# Patient Record
Sex: Female | Born: 1960 | Race: White | Hispanic: No | Marital: Married | Smoking: Never smoker
Health system: Southern US, Community
[De-identification: ages and names within clinical notes are randomized; demographics above are authoritative.]

## PROBLEM LIST (undated history)

## (undated) DIAGNOSIS — I2510 Atherosclerotic heart disease of native coronary artery without angina pectoris: Secondary | ICD-10-CM

## (undated) DIAGNOSIS — K219 Gastro-esophageal reflux disease without esophagitis: Secondary | ICD-10-CM

## (undated) DIAGNOSIS — R87619 Unspecified abnormal cytological findings in specimens from cervix uteri: Secondary | ICD-10-CM

## (undated) DIAGNOSIS — E785 Hyperlipidemia, unspecified: Secondary | ICD-10-CM

## (undated) DIAGNOSIS — F419 Anxiety disorder, unspecified: Secondary | ICD-10-CM

## (undated) DIAGNOSIS — E119 Type 2 diabetes mellitus without complications: Secondary | ICD-10-CM

## (undated) DIAGNOSIS — T7840XA Allergy, unspecified, initial encounter: Secondary | ICD-10-CM

## (undated) DIAGNOSIS — Z8489 Family history of other specified conditions: Secondary | ICD-10-CM

## (undated) DIAGNOSIS — Z973 Presence of spectacles and contact lenses: Secondary | ICD-10-CM

## (undated) DIAGNOSIS — I219 Acute myocardial infarction, unspecified: Secondary | ICD-10-CM

## (undated) DIAGNOSIS — M810 Age-related osteoporosis without current pathological fracture: Secondary | ICD-10-CM

## (undated) DIAGNOSIS — H409 Unspecified glaucoma: Secondary | ICD-10-CM

## (undated) HISTORY — DX: Atherosclerotic heart disease of native coronary artery without angina pectoris: I25.10

## (undated) HISTORY — DX: Unspecified glaucoma: H40.9

## (undated) HISTORY — DX: Acute myocardial infarction, unspecified: I21.9

## (undated) HISTORY — DX: Allergy, unspecified, initial encounter: T78.40XA

---

## 1982-09-25 HISTORY — PX: WISDOM TOOTH EXTRACTION: SHX21

## 1998-01-28 ENCOUNTER — Other Ambulatory Visit: Admission: RE | Admit: 1998-01-28 | Discharge: 1998-01-28 | Payer: Self-pay | Admitting: Obstetrics and Gynecology

## 1998-02-25 ENCOUNTER — Other Ambulatory Visit: Admission: RE | Admit: 1998-02-25 | Discharge: 1998-02-25 | Payer: Self-pay | Admitting: Obstetrics and Gynecology

## 1998-06-22 ENCOUNTER — Other Ambulatory Visit: Admission: RE | Admit: 1998-06-22 | Discharge: 1998-06-22 | Payer: Self-pay | Admitting: Obstetrics and Gynecology

## 1998-07-14 ENCOUNTER — Other Ambulatory Visit: Admission: RE | Admit: 1998-07-14 | Discharge: 1998-07-14 | Payer: Self-pay | Admitting: Obstetrics and Gynecology

## 1998-11-25 ENCOUNTER — Other Ambulatory Visit: Admission: RE | Admit: 1998-11-25 | Discharge: 1998-11-25 | Payer: Self-pay | Admitting: Obstetrics and Gynecology

## 1999-05-27 ENCOUNTER — Other Ambulatory Visit: Admission: RE | Admit: 1999-05-27 | Discharge: 1999-05-27 | Payer: Self-pay | Admitting: Obstetrics and Gynecology

## 2000-01-02 ENCOUNTER — Other Ambulatory Visit: Admission: RE | Admit: 2000-01-02 | Discharge: 2000-01-02 | Payer: Self-pay | Admitting: Obstetrics and Gynecology

## 2000-05-18 ENCOUNTER — Other Ambulatory Visit: Admission: RE | Admit: 2000-05-18 | Discharge: 2000-05-18 | Payer: Self-pay | Admitting: Obstetrics and Gynecology

## 2000-12-10 ENCOUNTER — Other Ambulatory Visit: Admission: RE | Admit: 2000-12-10 | Discharge: 2000-12-10 | Payer: Self-pay | Admitting: Obstetrics and Gynecology

## 2001-12-12 ENCOUNTER — Other Ambulatory Visit: Admission: RE | Admit: 2001-12-12 | Discharge: 2001-12-12 | Payer: Self-pay | Admitting: Obstetrics and Gynecology

## 2002-06-26 ENCOUNTER — Encounter: Admission: RE | Admit: 2002-06-26 | Discharge: 2002-06-26 | Payer: Self-pay | Admitting: Obstetrics and Gynecology

## 2002-06-26 ENCOUNTER — Encounter: Payer: Self-pay | Admitting: Obstetrics and Gynecology

## 2003-01-19 ENCOUNTER — Other Ambulatory Visit: Admission: RE | Admit: 2003-01-19 | Discharge: 2003-01-19 | Payer: Self-pay | Admitting: Obstetrics and Gynecology

## 2003-03-17 ENCOUNTER — Encounter: Payer: Self-pay | Admitting: Obstetrics and Gynecology

## 2003-03-17 ENCOUNTER — Encounter: Admission: RE | Admit: 2003-03-17 | Discharge: 2003-03-17 | Payer: Self-pay | Admitting: Obstetrics and Gynecology

## 2003-06-30 ENCOUNTER — Encounter: Admission: RE | Admit: 2003-06-30 | Discharge: 2003-06-30 | Payer: Self-pay | Admitting: Obstetrics and Gynecology

## 2003-06-30 ENCOUNTER — Encounter: Payer: Self-pay | Admitting: Obstetrics and Gynecology

## 2004-02-03 ENCOUNTER — Other Ambulatory Visit: Admission: RE | Admit: 2004-02-03 | Discharge: 2004-02-03 | Payer: Self-pay | Admitting: Obstetrics and Gynecology

## 2004-07-04 ENCOUNTER — Encounter: Admission: RE | Admit: 2004-07-04 | Discharge: 2004-07-04 | Payer: Self-pay | Admitting: Obstetrics and Gynecology

## 2004-08-02 ENCOUNTER — Encounter: Admission: RE | Admit: 2004-08-02 | Discharge: 2004-08-02 | Payer: Self-pay | Admitting: Obstetrics and Gynecology

## 2004-11-01 ENCOUNTER — Encounter: Admission: RE | Admit: 2004-11-01 | Discharge: 2004-11-01 | Payer: Self-pay | Admitting: Obstetrics and Gynecology

## 2005-02-08 ENCOUNTER — Other Ambulatory Visit: Admission: RE | Admit: 2005-02-08 | Discharge: 2005-02-08 | Payer: Self-pay | Admitting: Obstetrics and Gynecology

## 2005-07-05 ENCOUNTER — Encounter: Admission: RE | Admit: 2005-07-05 | Discharge: 2005-07-05 | Payer: Self-pay | Admitting: Obstetrics and Gynecology

## 2006-03-06 ENCOUNTER — Other Ambulatory Visit: Admission: RE | Admit: 2006-03-06 | Discharge: 2006-03-06 | Payer: Self-pay | Admitting: Obstetrics and Gynecology

## 2006-07-09 ENCOUNTER — Encounter: Admission: RE | Admit: 2006-07-09 | Discharge: 2006-07-09 | Payer: Self-pay | Admitting: Obstetrics and Gynecology

## 2006-07-19 ENCOUNTER — Encounter: Admission: RE | Admit: 2006-07-19 | Discharge: 2006-07-19 | Payer: Self-pay | Admitting: Obstetrics and Gynecology

## 2006-11-22 ENCOUNTER — Other Ambulatory Visit: Admission: RE | Admit: 2006-11-22 | Discharge: 2006-11-22 | Payer: Self-pay | Admitting: Obstetrics and Gynecology

## 2007-03-11 ENCOUNTER — Other Ambulatory Visit: Admission: RE | Admit: 2007-03-11 | Discharge: 2007-03-11 | Payer: Self-pay | Admitting: Obstetrics and Gynecology

## 2007-07-11 ENCOUNTER — Encounter: Admission: RE | Admit: 2007-07-11 | Discharge: 2007-07-11 | Payer: Self-pay | Admitting: Obstetrics and Gynecology

## 2007-09-09 ENCOUNTER — Other Ambulatory Visit: Admission: RE | Admit: 2007-09-09 | Discharge: 2007-09-09 | Payer: Self-pay | Admitting: Obstetrics and Gynecology

## 2008-03-11 ENCOUNTER — Other Ambulatory Visit: Admission: RE | Admit: 2008-03-11 | Discharge: 2008-03-11 | Payer: Self-pay | Admitting: Obstetrics and Gynecology

## 2008-07-13 ENCOUNTER — Encounter: Admission: RE | Admit: 2008-07-13 | Discharge: 2008-07-13 | Payer: Self-pay | Admitting: Obstetrics and Gynecology

## 2008-09-09 ENCOUNTER — Other Ambulatory Visit: Admission: RE | Admit: 2008-09-09 | Discharge: 2008-09-09 | Payer: Self-pay | Admitting: Obstetrics and Gynecology

## 2009-03-15 ENCOUNTER — Other Ambulatory Visit: Admission: RE | Admit: 2009-03-15 | Discharge: 2009-03-15 | Payer: Self-pay | Admitting: Obstetrics and Gynecology

## 2009-07-14 ENCOUNTER — Encounter: Admission: RE | Admit: 2009-07-14 | Discharge: 2009-07-14 | Payer: Self-pay | Admitting: Obstetrics and Gynecology

## 2010-07-18 ENCOUNTER — Encounter: Admission: RE | Admit: 2010-07-18 | Discharge: 2010-07-18 | Payer: Self-pay | Admitting: Obstetrics and Gynecology

## 2010-10-15 ENCOUNTER — Encounter: Payer: Self-pay | Admitting: Obstetrics and Gynecology

## 2010-10-16 ENCOUNTER — Encounter: Payer: Self-pay | Admitting: Obstetrics and Gynecology

## 2011-01-04 ENCOUNTER — Other Ambulatory Visit (HOSPITAL_COMMUNITY): Payer: Self-pay | Admitting: Obstetrics and Gynecology

## 2011-01-04 DIAGNOSIS — Z1231 Encounter for screening mammogram for malignant neoplasm of breast: Secondary | ICD-10-CM

## 2011-07-20 ENCOUNTER — Ambulatory Visit
Admission: RE | Admit: 2011-07-20 | Discharge: 2011-07-20 | Disposition: A | Discharge status: Home / Self Care | Payer: 59 | Source: Ambulatory Visit | Attending: Obstetrics and Gynecology | Admitting: Obstetrics and Gynecology

## 2011-07-20 DIAGNOSIS — Z1231 Encounter for screening mammogram for malignant neoplasm of breast: Secondary | ICD-10-CM

## 2012-02-28 ENCOUNTER — Other Ambulatory Visit: Payer: Self-pay | Admitting: Obstetrics and Gynecology

## 2012-02-28 DIAGNOSIS — Z1231 Encounter for screening mammogram for malignant neoplasm of breast: Secondary | ICD-10-CM

## 2012-07-22 ENCOUNTER — Ambulatory Visit
Admission: RE | Admit: 2012-07-22 | Discharge: 2012-07-22 | Disposition: A | Discharge status: Home / Self Care | Payer: 59 | Source: Ambulatory Visit | Attending: Obstetrics and Gynecology | Admitting: Obstetrics and Gynecology

## 2012-07-22 DIAGNOSIS — Z1231 Encounter for screening mammogram for malignant neoplasm of breast: Secondary | ICD-10-CM

## 2012-07-24 ENCOUNTER — Other Ambulatory Visit: Payer: Self-pay | Admitting: Obstetrics and Gynecology

## 2012-07-24 DIAGNOSIS — R928 Other abnormal and inconclusive findings on diagnostic imaging of breast: Secondary | ICD-10-CM

## 2012-07-31 ENCOUNTER — Ambulatory Visit
Admission: RE | Admit: 2012-07-31 | Discharge: 2012-07-31 | Disposition: A | Discharge status: Home / Self Care | Payer: 59 | Source: Ambulatory Visit | Attending: Obstetrics and Gynecology | Admitting: Obstetrics and Gynecology

## 2012-07-31 DIAGNOSIS — R928 Other abnormal and inconclusive findings on diagnostic imaging of breast: Secondary | ICD-10-CM

## 2013-03-03 ENCOUNTER — Other Ambulatory Visit: Payer: Self-pay

## 2013-03-03 DIAGNOSIS — Z1231 Encounter for screening mammogram for malignant neoplasm of breast: Secondary | ICD-10-CM

## 2013-03-05 ENCOUNTER — Encounter: Payer: Self-pay | Admitting: Gastroenterology

## 2013-04-23 ENCOUNTER — Other Ambulatory Visit: Payer: 59 | Admitting: Gastroenterology

## 2013-05-05 ENCOUNTER — Encounter: Payer: 59 | Admitting: Gastroenterology

## 2013-05-05 ENCOUNTER — Ambulatory Visit (AMBULATORY_SURGERY_CENTER): Payer: 59 | Admitting: *Deleted

## 2013-05-05 VITALS — Ht 63.5 in | Wt 128.2 lb

## 2013-05-05 DIAGNOSIS — Z1211 Encounter for screening for malignant neoplasm of colon: Secondary | ICD-10-CM

## 2013-05-05 MED ORDER — MOVIPREP 100 G PO SOLR: ORAL | Status: DC

## 2013-05-06 ENCOUNTER — Encounter: Payer: Self-pay | Admitting: Gastroenterology

## 2013-05-12 ENCOUNTER — Ambulatory Visit (AMBULATORY_SURGERY_CENTER): Payer: 59 | Admitting: Gastroenterology

## 2013-05-12 ENCOUNTER — Encounter: Payer: Self-pay | Admitting: Gastroenterology

## 2013-05-12 VITALS — BP 135/79 | HR 64 | Temp 97.7°F | Resp 18 | Ht 63.0 in | Wt 128.0 lb

## 2013-05-12 DIAGNOSIS — Z1211 Encounter for screening for malignant neoplasm of colon: Secondary | ICD-10-CM

## 2013-05-12 DIAGNOSIS — D126 Benign neoplasm of colon, unspecified: Secondary | ICD-10-CM

## 2013-05-12 MED ORDER — SODIUM CHLORIDE 0.9 % IV SOLN: 500.0000 mL | INTRAVENOUS | Status: DC

## 2013-05-12 NOTE — Patient Instructions (Signed)
YOU HAD AN ENDOSCOPIC PROCEDURE TODAY AT THE [facility]: Refer to the procedure report that was given to you for any specific questions about what was found during the examination.  If the procedure report does not answer your questions, please call your gastroenterologist to clarify.  If you requested that your care partner not be given the details of your procedure findings, then the procedure report has been included in a sealed envelope for you to review at your convenience later.  YOU SHOULD EXPECT: Some feelings of bloating in the abdomen. Passage of more gas than usual.  Walking can help get rid of the air that was put into your GI tract during the procedure and reduce the bloating. If you had a lower endoscopy (such as a colonoscopy or flexible sigmoidoscopy) you may notice spotting of blood in your stool or on the toilet paper. If you underwent a bowel prep for your procedure, then you may not have a normal bowel movement for a few days.  DIET: Your first meal following the procedure should be a light meal and then it is ok to progress to your normal diet.  A half-sandwich or bowl of soup is an example of a good first meal.  Heavy or fried foods are harder to digest and may make you feel nauseous or bloated.  Likewise meals heavy in dairy and vegetables can cause extra gas to form and this can also increase the bloating.  Drink plenty of fluids but you should avoid alcoholic beverages for 24 hours.  ACTIVITY: Your care partner should take you home directly after the procedure.  You should plan to take it easy, moving slowly for the rest of the day.  You can resume normal activity the day after the procedure however you should NOT DRIVE or use heavy machinery for 24 hours (because of the sedation medicines used during the test).    SYMPTOMS TO REPORT IMMEDIATELY: A gastroenterologist can be reached at any hour.  During normal business hours, 8:30 AM to 5:00 PM Monday through Friday,  call [phone].  After hours and on weekends, please call the GI answering service at [phone] who will take a message and have the physician on call contact you.   Following lower endoscopy (colonoscopy or flexible sigmoidoscopy):  Excessive amounts of blood in the stool  Significant tenderness or worsening of abdominal pains  Swelling of the abdomen that is new, acute  Fever of 100F or higher  Following upper endoscopy (EGD)  Vomiting of blood or coffee ground material  New chest pain or pain under the shoulder blades  Painful or persistently difficult swallowing  New shortness of breath  Fever of 100F or higher  Black, tarry-looking stools  FOLLOW UP: If any biopsies were taken you will be contacted by phone or by letter within the next 1-3 weeks.  Call your gastroenterologist if you have not heard about the biopsies in 3 weeks.  Our staff will call the home number listed on your records the next business day following your procedure to check on you and address any questions or concerns that you may have at that time regarding the information given to you following your procedure. This is a courtesy call and so if there is no answer at the home number and we have not heard from you through the emergency physician on call, we will assume that you have returned to your regular daily activities without incident.  SIGNATURES/CONFIDENTIALITY: You and/or your care   partner have signed paperwork which will be entered into your electronic medical record.  These signatures attest to the fact that that the information above on your After Visit Summary has been reviewed and is understood.  Full responsibility of the confidentiality of this discharge information lies with you and/or your care-partner.   INFORMATION ON POLYPS GIVEN TO YOU TODAY 

## 2013-05-12 NOTE — Progress Notes (Signed)
Procedure ends, to recovery, report given and VSS. 

## 2013-05-12 NOTE — Progress Notes (Signed)
Called to room to assist during endoscopic procedure.  Patient ID and intended procedure confirmed with present staff. Received instructions for my participation in the procedure from the performing physician.  

## 2013-05-12 NOTE — Op Note (Signed)
[facility] [address] [place], [address]   COLONOSCOPY PROCEDURE REPORT  PATIENT: Brenda, Chavez  MR#: [id number] BIRTHDATE: Feb 10, 1961 , 51  yrs. old GENDER: Female ENDOSCOPIST: Mardella Layman, MD, [facility] REFERRED BY: PROCEDURE DATE:  05/12/2013 PROCEDURE:   Colonoscopy with snare polypectomy First Screening Colonoscopy - Avg.  risk and is 50 yrs.  old or older Yes.  Prior Negative Screening - Now for repeat screening. N/A  History of Adenoma - Now for follow-up colonoscopy & has been > or = to 3 yrs.  N/A  Polyps Removed Today? Yes. ASA CLASS:   Class I INDICATIONS:average risk screening. MEDICATIONS: propofol (Diprivan) 200mg  IV  DESCRIPTION OF PROCEDURE:   After the risks benefits and alternatives of the procedure were thoroughly explained, informed consent was obtained.  A digital rectal exam revealed no abnormalities of the rectum.   The LB [id number]  endoscope was introduced through the anus and advanced to the cecum, which was identified by both the appendix and ileocecal valve. No adverse events experienced.   The quality of the prep was excellent, using MoviPrep  The instrument was then slowly withdrawn as the colon was fully examined.      COLON FINDINGS: A small smooth flat polyp was found in the rectum. A polypectomy was performed with a cold snare.  The resection was complete and the polyp tissue was completely retrieved.   The colon was otherwise normal.  There was no diverticulosis, inflammation, polyps or cancers unless previously stated.  Retroflexed views revealed no abnormalities. The time to cecum=3 minutes 25 seconds. Withdrawal time=10 minutes 24 seconds.  The scope was withdrawn and the procedure completed. COMPLICATIONS: There were no complications.  ENDOSCOPIC IMPRESSION: 1.   Small flat polyp was found in the rectum; polypectomy was performed with a cold snarepppprpbable hyperplastic polyp,r/o adenoma. 2.   The  colon was otherwise normal  RECOMMENDATIONS: 1.  Continue current medications 2.  Repeat colonoscopy in 5 years if polyp adenomatous; otherwise 10 years   eSigned:  Mardella Layman, MD, [facility] 05/12/2013 10:21 AM   cc:

## 2013-05-12 NOTE — Progress Notes (Signed)
Patient did not experience any of the following events: a burn prior to discharge; a fall within the facility; wrong site/side/patient/procedure/implant event; or a hospital transfer or hospital admission upon discharge from the facility. (G8907) Patient did not have preoperative order for IV antibiotic SSI prophylaxis. (G8918)  

## 2013-05-13 ENCOUNTER — Telehealth: Payer: Self-pay | Admitting: *Deleted

## 2013-05-13 NOTE — Telephone Encounter (Signed)
  Follow up Call-  Call back number 05/12/2013  Post procedure Call Back phone  # [phone]  Permission to leave phone message Yes     Patient questions:  Do you have a fever, pain , or abdominal swelling? no Pain Score  0 *  Have you tolerated food without any problems? yes  Have you been able to return to your normal activities? yes  Do you have any questions about your discharge instructions: Diet   no Medications  no Follow up visit  no  Do you have questions or concerns about your Care? no  Actions: * If pain score is 4 or above: No action needed, pain <4.

## 2013-05-16 ENCOUNTER — Encounter: Payer: Self-pay | Admitting: Gastroenterology

## 2013-07-23 ENCOUNTER — Ambulatory Visit
Admission: RE | Admit: 2013-07-23 | Discharge: 2013-07-23 | Disposition: A | Discharge status: Home / Self Care | Payer: 59 | Source: Ambulatory Visit

## 2013-07-23 DIAGNOSIS — Z1231 Encounter for screening mammogram for malignant neoplasm of breast: Secondary | ICD-10-CM

## 2014-03-24 ENCOUNTER — Telehealth: Payer: Self-pay

## 2014-03-24 NOTE — Telephone Encounter (Signed)
Pt left v/m requesting bone density report; spoke with pt and Dr Julien Girt is pt's PCP; offered to transfer call to Dr Julien Girt but pt said she would call Dr Julien Girt office later.

## 2014-06-22 ENCOUNTER — Other Ambulatory Visit: Payer: Self-pay

## 2014-06-22 DIAGNOSIS — Z1231 Encounter for screening mammogram for malignant neoplasm of breast: Secondary | ICD-10-CM

## 2014-07-24 ENCOUNTER — Ambulatory Visit
Admission: RE | Admit: 2014-07-24 | Discharge: 2014-07-24 | Disposition: A | Discharge status: Home / Self Care | Payer: 59 | Source: Ambulatory Visit

## 2014-07-24 ENCOUNTER — Encounter (INDEPENDENT_AMBULATORY_CARE_PROVIDER_SITE_OTHER): Payer: Self-pay

## 2014-07-24 DIAGNOSIS — Z1231 Encounter for screening mammogram for malignant neoplasm of breast: Secondary | ICD-10-CM

## 2014-09-22 ENCOUNTER — Encounter: Payer: Self-pay | Admitting: Internal Medicine

## 2014-09-22 ENCOUNTER — Ambulatory Visit (INDEPENDENT_AMBULATORY_CARE_PROVIDER_SITE_OTHER): Payer: 59 | Admitting: Internal Medicine

## 2014-09-22 VITALS — BP 122/80 | HR 69 | Temp 98.2°F | Wt 131.5 lb

## 2014-09-22 DIAGNOSIS — B9789 Other viral agents as the cause of diseases classified elsewhere: Secondary | ICD-10-CM

## 2014-09-22 DIAGNOSIS — J029 Acute pharyngitis, unspecified: Secondary | ICD-10-CM

## 2014-09-22 DIAGNOSIS — J028 Acute pharyngitis due to other specified organisms: Principal | ICD-10-CM

## 2014-09-22 LAB — POCT RAPID STREP A (OFFICE): Rapid Strep A Screen: NEGATIVE

## 2014-09-22 MED ORDER — METHYLPREDNISOLONE ACETATE 80 MG/ML IJ SUSP
80.0000 mg | Freq: Once | INTRAMUSCULAR | Status: AC
  Administered 2014-09-22: 80 mg via INTRAMUSCULAR

## 2014-09-22 NOTE — Addendum Note (Signed)
Addended by: Lurlean Nanny on: 09/22/2014 02:35 PM   Modules accepted: Orders

## 2014-09-22 NOTE — Progress Notes (Signed)
Pre visit review using our clinic review tool, if applicable. No additional management support is needed unless otherwise documented below in the visit note. 

## 2014-09-22 NOTE — Addendum Note (Signed)
Addended by: Lurlean Nanny on: 09/22/2014 11:01 AM   Modules accepted: Orders

## 2014-09-22 NOTE — Patient Instructions (Signed)

## 2014-09-22 NOTE — Progress Notes (Signed)
HPI  Pt presents to the clinic today with c/o sore throat. She reports this started 9 days ago. The sore throat seems to be worse at night. She has had some associated sinus pressure and nasal congestion.She is not blowing anything out of her nose. She denies fever, chills or body aches. She has tried salt water gargles and alka seltzer with minimal relief. She has not had sick contacts that she is aware of.  Review of Systems   No past medical history on file.  Family History  Problem Relation Age of Onset  . Colon cancer Cousin 59  . Heart disease Mother   . Heart disease Father   . COPD Father   . Breast cancer Maternal Aunt   . Diabetes Maternal Aunt   . Heart disease Paternal Aunt     History   Social History  . Marital Status: Married    Spouse Name: N/A    Number of Children: N/A  . Years of Education: N/A   Occupational History  . Not on file.   Social History Main Topics  . Smoking status: Never Smoker   . Smokeless tobacco: Never Used  . Alcohol Use: No  . Drug Use: No  . Sexual Activity: Not on file   Other Topics Concern  . Not on file   Social History Narrative    Allergies  Allergen Reactions  . Erythromycin Nausea And Vomiting  . Floxin [Ofloxacin]     Unable to close eyes  . Macrobid [Nitrofurantoin Macrocrystal] Nausea And Vomiting     Constitutional: Positive headache. Denies fatigue, fever or abrupt weight changes.  HEENT:  Positive sinus pain, nasal congestion and sore throat. Denies eye redness, ear pain, ringing in the ears, wax buildup, runny nose or bloody nose. Respiratory: Denies cough, difficulty breathing or shortness of breath.  Cardiovascular: Denies chest pain, chest tightness, palpitations or swelling in the hands or feet.   No other specific complaints in a complete review of systems (except as listed in HPI above).  Objective:  BP 122/80 mmHg  Pulse 69  Temp(Src) 98.2 F (36.8 C) (Oral)  Wt 131 lb 8 oz (59.648 kg)   SpO2 98%   General: Appears her stated age, well developed, well nourished in NAD. HEENT: Head: normal shape and size, no sinus tenderness noted; Eyes: sclera white, no icterus, conjunctiva pink; Ears: Tm's gray and intact, normal light reflex; Nose: mucosa pink and moist, septum midline; Throat/Mouth: Teeth present, mucosa erythematous and moist, no exudate noted, no lesions or ulcerations noted.  Neck: No adenopathy noted. Cardiovascular: Normal rate and rhythm. S1,S2 noted.  No murmur, rubs or gallops noted. Pulmonary/Chest: Normal effort and positive vesicular breath sounds. No respiratory distress. No wheezes, rales or ronchi noted.      Assessment & Plan:   Viral sore throat  RST negative Will give 80 mg Depo IM today Try Advil and continue salt water gargles at home If symptoms worsen or persist, call me back by Thursday, will treat with Amoxil x 10 days  RTC as needed or if symptoms persist.

## 2014-11-02 ENCOUNTER — Ambulatory Visit: Payer: 59 | Admitting: Internal Medicine

## 2015-02-01 ENCOUNTER — Other Ambulatory Visit: Payer: Self-pay

## 2015-02-01 DIAGNOSIS — Z1231 Encounter for screening mammogram for malignant neoplasm of breast: Secondary | ICD-10-CM

## 2015-04-26 DIAGNOSIS — I219 Acute myocardial infarction, unspecified: Secondary | ICD-10-CM

## 2015-04-26 HISTORY — DX: Acute myocardial infarction, unspecified: I21.9

## 2015-05-14 ENCOUNTER — Encounter (HOSPITAL_COMMUNITY): Payer: Self-pay | Admitting: Emergency Medicine

## 2015-05-14 ENCOUNTER — Encounter (HOSPITAL_COMMUNITY)
Admission: EM | Disposition: A | Discharge status: Home / Self Care | Payer: 59 | Source: Home / Self Care | Attending: Cardiology

## 2015-05-14 ENCOUNTER — Emergency Department (HOSPITAL_COMMUNITY): Payer: 59

## 2015-05-14 ENCOUNTER — Inpatient Hospital Stay (HOSPITAL_COMMUNITY)
Admission: EM | Admit: 2015-05-14 | Discharge: 2015-05-15 | DRG: 247 | Disposition: A | Discharge status: Home / Self Care | Payer: 59 | Attending: Cardiology | Admitting: Cardiology

## 2015-05-14 DIAGNOSIS — M810 Age-related osteoporosis without current pathological fracture: Secondary | ICD-10-CM | POA: Diagnosis present

## 2015-05-14 DIAGNOSIS — I214 Non-ST elevation (NSTEMI) myocardial infarction: Secondary | ICD-10-CM | POA: Diagnosis not present

## 2015-05-14 DIAGNOSIS — Z8249 Family history of ischemic heart disease and other diseases of the circulatory system: Secondary | ICD-10-CM | POA: Diagnosis not present

## 2015-05-14 DIAGNOSIS — E785 Hyperlipidemia, unspecified: Secondary | ICD-10-CM | POA: Diagnosis present

## 2015-05-14 DIAGNOSIS — R079 Chest pain, unspecified: Secondary | ICD-10-CM

## 2015-05-14 DIAGNOSIS — I2510 Atherosclerotic heart disease of native coronary artery without angina pectoris: Secondary | ICD-10-CM | POA: Diagnosis present

## 2015-05-14 DIAGNOSIS — Z95820 Peripheral vascular angioplasty status with implants and grafts: Secondary | ICD-10-CM

## 2015-05-14 DIAGNOSIS — F419 Anxiety disorder, unspecified: Secondary | ICD-10-CM | POA: Diagnosis present

## 2015-05-14 DIAGNOSIS — Z881 Allergy status to other antibiotic agents status: Secondary | ICD-10-CM | POA: Diagnosis not present

## 2015-05-14 DIAGNOSIS — Z955 Presence of coronary angioplasty implant and graft: Secondary | ICD-10-CM

## 2015-05-14 DIAGNOSIS — I252 Old myocardial infarction: Secondary | ICD-10-CM

## 2015-05-14 DIAGNOSIS — Z79899 Other long term (current) drug therapy: Secondary | ICD-10-CM | POA: Diagnosis not present

## 2015-05-14 HISTORY — DX: Hyperlipidemia, unspecified: E78.5

## 2015-05-14 HISTORY — DX: Old myocardial infarction: I25.2

## 2015-05-14 HISTORY — DX: Anxiety disorder, unspecified: F41.9

## 2015-05-14 HISTORY — PX: CARDIAC CATHETERIZATION: SHX172

## 2015-05-14 HISTORY — DX: Age-related osteoporosis without current pathological fracture: M81.0

## 2015-05-14 HISTORY — DX: Presence of coronary angioplasty implant and graft: Z95.5

## 2015-05-14 LAB — CBC WITH DIFFERENTIAL/PLATELET
Basophils Absolute: 0 10*3/uL (ref 0.0–0.1)
Basophils Relative: 0 % (ref 0–1)
Eosinophils Absolute: 0.1 10*3/uL (ref 0.0–0.7)
Eosinophils Relative: 2 % (ref 0–5)
HCT: 42.7 % (ref 36.0–46.0)
Hemoglobin: 14.2 g/dL (ref 12.0–15.0)
Lymphocytes Relative: 33 % (ref 12–46)
Lymphs Abs: 2.4 10*3/uL (ref 0.7–4.0)
MCH: 30.7 pg (ref 26.0–34.0)
MCHC: 33.3 g/dL (ref 30.0–36.0)
MCV: 92.2 fL (ref 78.0–100.0)
Monocytes Absolute: 0.7 10*3/uL (ref 0.1–1.0)
Monocytes Relative: 9 % (ref 3–12)
Neutro Abs: 4.2 10*3/uL (ref 1.7–7.7)
Neutrophils Relative %: 56 % (ref 43–77)
Platelets: 247 10*3/uL (ref 150–400)
RBC: 4.63 MIL/uL (ref 3.87–5.11)
RDW: 13.6 % (ref 11.5–15.5)
WBC: 7.4 10*3/uL (ref 4.0–10.5)

## 2015-05-14 LAB — CBC
HCT: 41.8 % (ref 36.0–46.0)
Hemoglobin: 13.5 g/dL (ref 12.0–15.0)
MCH: 29.7 pg (ref 26.0–34.0)
MCHC: 32.3 g/dL (ref 30.0–36.0)
MCV: 92.1 fL (ref 78.0–100.0)
Platelets: 246 10*3/uL (ref 150–400)
RBC: 4.54 MIL/uL (ref 3.87–5.11)
RDW: 13.6 % (ref 11.5–15.5)
WBC: 8.7 10*3/uL (ref 4.0–10.5)

## 2015-05-14 LAB — COMPREHENSIVE METABOLIC PANEL
ALT: 30 U/L (ref 14–54)
AST: 60 U/L — ABNORMAL HIGH (ref 15–41)
Albumin: 4.3 g/dL (ref 3.5–5.0)
Alkaline Phosphatase: 63 U/L (ref 38–126)
Anion gap: 12 (ref 5–15)
BUN: 15 mg/dL (ref 6–20)
CO2: 27 mmol/L (ref 22–32)
Calcium: 9.6 mg/dL (ref 8.9–10.3)
Chloride: 103 mmol/L (ref 101–111)
Creatinine, Ser: 0.92 mg/dL (ref 0.44–1.00)
GFR calc Af Amer: >60 mL/min (ref >60)
GFR calc non Af Amer: >60 mL/min (ref >60)
Glucose, Bld: 115 mg/dL — ABNORMAL HIGH (ref 65–99)
Potassium: 3.7 mmol/L (ref 3.5–5.1)
Sodium: 142 mmol/L (ref 135–145)
Total Bilirubin: 0.7 mg/dL (ref 0.3–1.2)
Total Protein: 6.8 g/dL (ref 6.5–8.1)

## 2015-05-14 LAB — LIPID PANEL
Cholesterol: 181 mg/dL (ref 0–200)
HDL: 68 mg/dL (ref >40)
LDL Cholesterol: 102 mg/dL — ABNORMAL HIGH (ref 0–99)
Total CHOL/HDL Ratio: 2.7 RATIO
Triglycerides: 57 mg/dL (ref <150)
VLDL: 11 mg/dL (ref 0–40)

## 2015-05-14 LAB — BASIC METABOLIC PANEL
Anion gap: 10 (ref 5–15)
BUN: 15 mg/dL (ref 6–20)
CO2: 28 mmol/L (ref 22–32)
Calcium: 9.2 mg/dL (ref 8.9–10.3)
Chloride: 104 mmol/L (ref 101–111)
Creatinine, Ser: 0.93 mg/dL (ref 0.44–1.00)
GFR calc Af Amer: >60 mL/min (ref >60)
GFR calc non Af Amer: >60 mL/min (ref >60)
Glucose, Bld: 123 mg/dL — ABNORMAL HIGH (ref 65–99)
Potassium: 4.5 mmol/L (ref 3.5–5.1)
Sodium: 142 mmol/L (ref 135–145)

## 2015-05-14 LAB — TSH: TSH: 2.251 u[IU]/mL (ref 0.350–4.500)

## 2015-05-14 LAB — I-STAT TROPONIN, ED: Troponin i, poc: 2.19 ng/mL (ref 0.00–0.08)

## 2015-05-14 LAB — BRAIN NATRIURETIC PEPTIDE: B Natriuretic Peptide: 118.9 pg/mL — ABNORMAL HIGH (ref 0.0–100.0)

## 2015-05-14 LAB — PROTIME-INR
INR: 0.86 (ref 0.00–1.49)
Prothrombin Time: 12 seconds (ref 11.6–15.2)

## 2015-05-14 LAB — TROPONIN I: Troponin I: 2.12 ng/mL (ref <0.031)

## 2015-05-14 LAB — LIPASE, BLOOD: Lipase: 33 U/L (ref 22–51)

## 2015-05-14 LAB — POCT ACTIVATED CLOTTING TIME: Activated Clotting Time: 362 seconds

## 2015-05-14 SURGERY — LEFT HEART CATH AND CORONARY ANGIOGRAPHY
Anesthesia: LOCAL

## 2015-05-14 MED ORDER — FENTANYL CITRATE (PF) 100 MCG/2ML IJ SOLN
INTRAMUSCULAR | Status: DC | PRN
  Administered 2015-05-14: 25 ug via INTRAVENOUS

## 2015-05-14 MED ORDER — LIDOCAINE HCL (PF) 1 % IJ SOLN
INTRAMUSCULAR | Status: AC
  Filled 2015-05-14: qty 30

## 2015-05-14 MED ORDER — TICAGRELOR 90 MG PO TABS
90.0000 mg | ORAL_TABLET | Freq: Two times a day (BID) | ORAL | Status: DC
  Administered 2015-05-14 – 2015-05-15 (×2): 90 mg via ORAL
  Filled 2015-05-14 (×2): qty 1

## 2015-05-14 MED ORDER — ASPIRIN 81 MG PO CHEW: 81.0000 mg | CHEWABLE_TABLET | ORAL | Status: DC

## 2015-05-14 MED ORDER — SODIUM CHLORIDE 0.9 % IV SOLN: INTRAVENOUS | Status: AC

## 2015-05-14 MED ORDER — SODIUM CHLORIDE 0.9 % IV SOLN: 250.0000 mL | INTRAVENOUS | Status: DC | PRN

## 2015-05-14 MED ORDER — NITROGLYCERIN 0.4 MG SL SUBL
0.4000 mg | SUBLINGUAL_TABLET | SUBLINGUAL | Status: DC | PRN
  Administered 2015-05-14 (×3): 0.4 mg via SUBLINGUAL
  Filled 2015-05-14 (×2): qty 1

## 2015-05-14 MED ORDER — HEPARIN BOLUS VIA INFUSION
4000.0000 [IU] | Freq: Once | INTRAVENOUS | Status: AC
  Administered 2015-05-14: 4000 [IU] via INTRAVENOUS
  Filled 2015-05-14: qty 4000

## 2015-05-14 MED ORDER — HEPARIN (PORCINE) IN NACL 2-0.9 UNIT/ML-% IJ SOLN
INTRAMUSCULAR | Status: AC
  Filled 2015-05-14: qty 1000

## 2015-05-14 MED ORDER — NITROGLYCERIN IN D5W 200-5 MCG/ML-% IV SOLN
10.0000 ug/min | Freq: Once | INTRAVENOUS | Status: AC
  Administered 2015-05-14: 10 ug/min via INTRAVENOUS
  Filled 2015-05-14: qty 250

## 2015-05-14 MED ORDER — CITALOPRAM HYDROBROMIDE 20 MG PO TABS
20.0000 mg | ORAL_TABLET | Freq: Every day | ORAL | Status: DC
  Administered 2015-05-14 – 2015-05-15 (×2): 20 mg via ORAL
  Filled 2015-05-14 (×3): qty 1

## 2015-05-14 MED ORDER — ONDANSETRON HCL 4 MG/2ML IJ SOLN
4.0000 mg | Freq: Once | INTRAMUSCULAR | Status: AC
  Administered 2015-05-14: 4 mg via INTRAVENOUS
  Filled 2015-05-14: qty 2

## 2015-05-14 MED ORDER — BIVALIRUDIN BOLUS VIA INFUSION - CUPID
INTRAVENOUS | Status: DC | PRN
  Administered 2015-05-14: 47.25 mg via INTRAVENOUS

## 2015-05-14 MED ORDER — SODIUM CHLORIDE 0.9 % IV SOLN
INTRAVENOUS | Status: DC
Start: 2015-05-14 — End: 2015-05-14
  Administered 2015-05-14: 08:00:00 via INTRAVENOUS

## 2015-05-14 MED ORDER — ONDANSETRON HCL 4 MG/2ML IJ SOLN: 4.0000 mg | Freq: Four times a day (QID) | INTRAMUSCULAR | Status: DC | PRN

## 2015-05-14 MED ORDER — VERAPAMIL HCL 2.5 MG/ML IV SOLN
INTRAVENOUS | Status: AC
  Filled 2015-05-14: qty 2

## 2015-05-14 MED ORDER — NITROGLYCERIN 1 MG/10 ML FOR IR/CATH LAB
INTRA_ARTERIAL | Status: AC
  Filled 2015-05-14: qty 10

## 2015-05-14 MED ORDER — ASPIRIN EC 81 MG PO TBEC
81.0000 mg | DELAYED_RELEASE_TABLET | Freq: Every day | ORAL | Status: DC
  Administered 2015-05-15: 09:00:00 81 mg via ORAL
  Filled 2015-05-14: qty 1

## 2015-05-14 MED ORDER — ASPIRIN 300 MG RE SUPP: 300.0000 mg | RECTAL | Status: DC

## 2015-05-14 MED ORDER — HEPARIN SODIUM (PORCINE) 1000 UNIT/ML IJ SOLN
INTRAMUSCULAR | Status: AC
  Filled 2015-05-14: qty 1

## 2015-05-14 MED ORDER — SODIUM CHLORIDE 0.9 % IV SOLN
250.0000 mg | INTRAVENOUS | Status: DC | PRN
Start: 2015-05-14 — End: 2015-05-14
  Administered 2015-05-14: 1.75 mg/kg/h via INTRAVENOUS

## 2015-05-14 MED ORDER — ANGIOPLASTY BOOK
Freq: Once | Status: AC
  Administered 2015-05-15: 06:00:00
  Filled 2015-05-14: qty 1

## 2015-05-14 MED ORDER — ASPIRIN 81 MG PO CHEW: 324.0000 mg | CHEWABLE_TABLET | ORAL | Status: DC

## 2015-05-14 MED ORDER — SODIUM CHLORIDE 0.9 % IJ SOLN: 3.0000 mL | Freq: Two times a day (BID) | INTRAMUSCULAR | Status: DC

## 2015-05-14 MED ORDER — BIVALIRUDIN 250 MG IV SOLR
INTRAVENOUS | Status: AC
  Filled 2015-05-14: qty 250

## 2015-05-14 MED ORDER — SODIUM CHLORIDE 0.9 % IJ SOLN: 3.0000 mL | INTRAMUSCULAR | Status: DC | PRN

## 2015-05-14 MED ORDER — ACETAMINOPHEN 325 MG PO TABS: 650.0000 mg | ORAL_TABLET | ORAL | Status: DC | PRN

## 2015-05-14 MED ORDER — ACTIVE PARTNERSHIP FOR HEALTH OF YOUR HEART BOOK
Freq: Once | Status: AC
Start: 2015-05-14 — End: 2015-05-15
  Administered 2015-05-15: 07:00:00
  Filled 2015-05-14 (×2): qty 1

## 2015-05-14 MED ORDER — ATORVASTATIN CALCIUM 80 MG PO TABS
80.0000 mg | ORAL_TABLET | Freq: Every day | ORAL | Status: DC
  Administered 2015-05-14: 80 mg via ORAL
  Filled 2015-05-14 (×2): qty 1

## 2015-05-14 MED ORDER — TICAGRELOR 90 MG PO TABS
ORAL_TABLET | ORAL | Status: DC | PRN
  Administered 2015-05-14: 180 mg via ORAL

## 2015-05-14 MED ORDER — ZOLPIDEM TARTRATE 5 MG PO TABS: 5.0000 mg | ORAL_TABLET | Freq: Every evening | ORAL | Status: DC | PRN

## 2015-05-14 MED ORDER — MORPHINE SULFATE (PF) 4 MG/ML IV SOLN
4.0000 mg | Freq: Once | INTRAVENOUS | Status: AC
Start: 2015-05-14 — End: 2015-05-14
  Administered 2015-05-14: 4 mg via INTRAVENOUS
  Filled 2015-05-14: qty 1

## 2015-05-14 MED ORDER — ASPIRIN 325 MG PO TABS
325.0000 mg | ORAL_TABLET | Freq: Once | ORAL | Status: AC
  Administered 2015-05-14: 325 mg via ORAL
  Filled 2015-05-14: qty 1

## 2015-05-14 MED ORDER — MIDAZOLAM HCL 2 MG/2ML IJ SOLN
INTRAMUSCULAR | Status: DC | PRN
Start: 2015-05-14 — End: 2015-05-14
  Administered 2015-05-14 (×2): 1 mg via INTRAVENOUS

## 2015-05-14 MED ORDER — LIDOCAINE HCL (PF) 1 % IJ SOLN
INTRAMUSCULAR | Status: DC | PRN
  Administered 2015-05-14: 11:00:00

## 2015-05-14 MED ORDER — FENTANYL CITRATE (PF) 100 MCG/2ML IJ SOLN
INTRAMUSCULAR | Status: AC
  Filled 2015-05-14: qty 4

## 2015-05-14 MED ORDER — HEPARIN (PORCINE) IN NACL 100-0.45 UNIT/ML-% IJ SOLN
750.0000 [IU]/h | INTRAMUSCULAR | Status: DC
  Administered 2015-05-14: 750 [IU]/h via INTRAVENOUS
  Filled 2015-05-14: qty 250

## 2015-05-14 MED ORDER — MIDAZOLAM HCL 2 MG/2ML IJ SOLN
INTRAMUSCULAR | Status: AC
  Filled 2015-05-14: qty 4

## 2015-05-14 MED ORDER — METOPROLOL TARTRATE 12.5 MG HALF TABLET
12.5000 mg | ORAL_TABLET | Freq: Two times a day (BID) | ORAL | Status: DC
  Administered 2015-05-14 – 2015-05-15 (×2): 12.5 mg via ORAL
  Filled 2015-05-14 (×5): qty 1

## 2015-05-14 MED ORDER — TICAGRELOR 90 MG PO TABS
ORAL_TABLET | ORAL | Status: AC
  Filled 2015-05-14: qty 2

## 2015-05-14 MED ORDER — NITROGLYCERIN 0.4 MG SL SUBL: 0.4000 mg | SUBLINGUAL_TABLET | SUBLINGUAL | Status: DC | PRN

## 2015-05-14 MED ORDER — SODIUM CHLORIDE 0.9 % IJ SOLN
3.0000 mL | INTRAMUSCULAR | Status: DC | PRN
  Administered 2015-05-14: 3 mL via INTRAVENOUS
  Filled 2015-05-14: qty 3

## 2015-05-14 SURGICAL SUPPLY — 21 items
BALLN EMERGE MR 2.0X12 (BALLOONS) ×2
BALLN ~~LOC~~ EMERGE MR 2.5X8 (BALLOONS) ×2
BALLOON EMERGE MR 2.0X12 (BALLOONS) ×1 IMPLANT
BALLOON ~~LOC~~ EMERGE MR 2.5X8 (BALLOONS) ×1 IMPLANT
CATH INFINITI 5FR MULTPACK ANG (CATHETERS) ×2 IMPLANT
CATH INFINITI JR4 5F (CATHETERS) ×2 IMPLANT
CATH VISTA GUIDE 6FR XBLAD3.5 (CATHETERS) ×2 IMPLANT
DEVICE WIRE ANGIOSEAL 6FR (Vascular Products) ×2 IMPLANT
GLIDESHEATH SLEND SS 6F .021 (SHEATH) ×2 IMPLANT
KIT ENCORE 26 ADVANTAGE (KITS) ×2 IMPLANT
KIT HEART LEFT (KITS) ×2 IMPLANT
PACK CARDIAC CATHETERIZATION (CUSTOM PROCEDURE TRAY) ×2 IMPLANT
SHEATH PINNACLE 5F 10CM (SHEATH) ×2 IMPLANT
SHEATH PINNACLE 6F 10CM (SHEATH) ×2 IMPLANT
STENT PROMUS PREM MR 2.25X12 (Permanent Stent) ×2 IMPLANT
SYR MEDRAD MARK V 150ML (SYRINGE) ×2 IMPLANT
TRANSDUCER W/STOPCOCK (MISCELLANEOUS) ×2 IMPLANT
TUBING CIL FLEX 10 FLL-RA (TUBING) ×2 IMPLANT
WIRE COUGAR XT STRL 190CM (WIRE) ×2 IMPLANT
WIRE EMERALD 3MM-J .035X150CM (WIRE) ×2 IMPLANT
WIRE SAFE-T 1.5MM-J .035X260CM (WIRE) ×2 IMPLANT

## 2015-05-14 NOTE — ED Notes (Signed)
Transporting patient to new room assignment. 

## 2015-05-14 NOTE — ED Notes (Signed)
Pt in from home, reports mid CP that started last night. Reports that it radiates down both arms. States pain started when she was walking up steps at work. Was able to relieve pain with aleve but pain came back and got worse. Pt threw up en route

## 2015-05-14 NOTE — Progress Notes (Signed)
Pt. arrived to the floor placed on telemetry box. Pt. alert and oriented and stated she has no pain.   Upon arrival pt. had a Nitro drip at 3 ml/hr. There was no orders present. RN notified doctor Philbert Riser. Doctor Philbert Riser gave orders to DC Nitro drip and keep NPO in anticipation of cath lab in the am.   Will continue to monitor  Angus Seller

## 2015-05-14 NOTE — H&P (Signed)
Referring Physician: Dr. Venora Maples Primary Physician: Primary Cardiologist: Reason for Consultation: NSTEMI   HPI: 54 yo woman with anxiety and osteoporosis who presents to ED with chest pain.  Initial episode over the weekend while working in garden, exerting herself in the heat.  She though she had overexerted herself and pain resolved quickly with rest so didn't pay much attention to it.  She had another episode while at work while going up and down stairs with co-workers for exercise, this too resolved with rest.  However, last night around 10 pm pain started and did not improve prompting ED visit.  In ED she received ASA, NTG and MSO4 with resolution of pain.  ECG without ischemic changes but iSTAT troponin 2 prompting cardiology consult.    Her initial episodes were described as throbbing pain, center chest with associated bilateral arm discomfort.  Tonight, it was more burning in quality with similar arm discomfort and associated with nausea and 1 emesis.      Review of Systems:     Cardiac Review of Systems: {Y] = yes [ ]  = no  Chest Pain [ x   ]  Resting SOB [   ] Exertional SOB  [  ]  Orthopnea [  ]   Pedal Edema [   ]    Palpitations [  ] Syncope  [  ]   Presyncope [   ]  General Review of Systems: [Y] = yes [  ]=no Constitional: recent weight change [  ]; anorexia [  ]; fatigue [  ]; nausea [  ]; night sweats [  ]; fever [  ]; or chills [  ];                                                                      Eyes : blurred vision [  ]; diplopia [   ]; vision changes [  ];  Amaurosis fugax[  ]; Resp: cough [  ];  wheezing[  ];  hemoptysis[  ];  PND [  ];  GI:  gallstones[  ], vomiting[x  ];  dysphagia[  ]; melena[  ];  hematochezia [  ]; heartburn[  ];   GU: kidney stones [  ]; hematuria[  ];   dysuria [  ];  nocturia[  ]; incontinence [  ];             Skin: rash, swelling[  ];, hair loss[  ];  peripheral edema[  ];  or itching[  ]; Musculosketetal: myalgias[  ];  joint  swelling[  ];  joint erythema[  ];  joint pain[  ];  back pain[  ];  Heme/Lymph: bruising[  ];  bleeding[  ];  anemia[  ];  Neuro: TIA[  ];  headaches[  ];  stroke[  ];  vertigo[  ];  seizures[  ];   paresthesias[  ];  difficulty walking[  ];  Psych:depression[  ]; anxiety[  ];  Endocrine: diabetes[  ];  thyroid dysfunction[  ];  Other:  Past Medical History  Diagnosis Date  . Anxiety   . Osteoporosis      (Not in a hospital admission)  No current facility-administered medications on file prior to encounter.  Current Outpatient Prescriptions on File Prior to Encounter  Medication Sig Dispense Refill  . alendronate (FOSAMAX) 70 MG tablet     . cholecalciferol (VITAMIN D) 1000 UNITS tablet Take 1,000 Units by mouth daily.    . citalopram (CELEXA) 20 MG tablet         Infusions: . heparin 750 Units/hr (05/14/15 0327)    Allergies  Allergen Reactions  . Erythromycin Nausea And Vomiting  . Floxin [Ofloxacin]     Unable to close eyes  . Macrobid [Nitrofurantoin Macrocrystal] Nausea And Vomiting    Social History   Social History  . Marital Status: Married    Spouse Name: N/A  . Number of Children: N/A  . Years of Education: N/A   Occupational History  . Not on file.   Social History Main Topics  . Smoking status: Never Smoker   . Smokeless tobacco: Never Used  . Alcohol Use: No  . Drug Use: No  . Sexual Activity: Not on file   Other Topics Concern  . Not on file   Social History Narrative    Family History  Problem Relation Age of Onset  . Colon cancer Cousin 41  . Heart disease Mother   . Heart disease Father   . COPD Father   . Breast cancer Maternal Aunt   . Diabetes Maternal Aunt   . Heart disease Paternal Aunt     PHYSICAL EXAM: Filed Vitals:   05/14/15 0216  BP: 152/84  Pulse: 64  Temp: 98.2 F (36.8 C)  Resp: 20    No intake or output data in the 24 hours ending 05/14/15 0343  General:  Well appearing. No respiratory  difficulty HEENT: normal Neck: supple. no JVD. Carotids 2+ bilat; no bruits. No lymphadenopathy or thryomegaly appreciated. Cor: PMI nondisplaced. Regular rate & rhythm. No rubs, gallops or murmurs. 2+ radial pulse Lungs: clear Abdomen: soft, nontender, nondistended. No hepatosplenomegaly. No bruits or masses. Good bowel sounds. Extremities: no cyanosis, clubbing, rash, edema Neuro: alert & oriented x 3, cranial nerves grossly intact. moves all 4 extremities w/o difficulty. Affect pleasant.  ECG: SR, no ischemic changes  Results for orders placed or performed during the hospital encounter of 05/14/15 (from the past 24 hour(s))  CBC with Differential     Status: None   Collection Time: 05/14/15  2:21 AM  Result Value Ref Range   WBC 7.4 4.0 - 10.5 K/uL   RBC 4.63 3.87 - 5.11 MIL/uL   Hemoglobin 14.2 12.0 - 15.0 g/dL   HCT 42.7 36.0 - 46.0 %   MCV 92.2 78.0 - 100.0 fL   MCH 30.7 26.0 - 34.0 pg   MCHC 33.3 30.0 - 36.0 g/dL   RDW 13.6 11.5 - 15.5 %   Platelets 247 150 - 400 K/uL   Neutrophils Relative % 56 43 - 77 %   Neutro Abs 4.2 1.7 - 7.7 K/uL   Lymphocytes Relative 33 12 - 46 %   Lymphs Abs 2.4 0.7 - 4.0 K/uL   Monocytes Relative 9 3 - 12 %   Monocytes Absolute 0.7 0.1 - 1.0 K/uL   Eosinophils Relative 2 0 - 5 %   Eosinophils Absolute 0.1 0.0 - 0.7 K/uL   Basophils Relative 0 0 - 1 %   Basophils Absolute 0.0 0.0 - 0.1 K/uL  Comprehensive metabolic panel     Status: Abnormal   Collection Time: 05/14/15  2:21 AM  Result Value Ref Range   Sodium 142 135 - 145  mmol/L   Potassium 3.7 3.5 - 5.1 mmol/L   Chloride 103 101 - 111 mmol/L   CO2 27 22 - 32 mmol/L   Glucose, Bld 115 (H) 65 - 99 mg/dL   BUN 15 6 - 20 mg/dL   Creatinine, Ser 0.92 0.44 - 1.00 mg/dL   Calcium 9.6 8.9 - 10.3 mg/dL   Total Protein 6.8 6.5 - 8.1 g/dL   Albumin 4.3 3.5 - 5.0 g/dL   AST 60 (H) 15 - 41 U/L   ALT 30 14 - 54 U/L   Alkaline Phosphatase 63 38 - 126 U/L   Total Bilirubin 0.7 0.3 - 1.2 mg/dL    GFR calc non Af Amer >60 >60 mL/min   GFR calc Af Amer >60 >60 mL/min   Anion gap 12 5 - 15  Lipase, blood     Status: None   Collection Time: 05/14/15  2:21 AM  Result Value Ref Range   Lipase 33 22 - 51 U/L  Protime-INR     Status: None   Collection Time: 05/14/15  2:21 AM  Result Value Ref Range   Prothrombin Time 12.0 11.6 - 15.2 seconds   INR 0.86 0.00 - 1.49  I-stat troponin, ED     Status: Abnormal   Collection Time: 05/14/15  2:27 AM  Result Value Ref Range   Troponin i, poc 2.19 (HH) 0.00 - 0.08 ng/mL   Comment NOTIFIED PHYSICIAN    Comment 3           Dg Chest 2 View  05/14/2015   CLINICAL DATA:  Chest pain and nausea for 3 days  EXAM: CHEST  2 VIEW  COMPARISON:  None.  FINDINGS: Normal heart size and mediastinal contours. No acute infiltrate or edema. No effusion or pneumothorax. No acute osseous findings.  IMPRESSION: No active cardiopulmonary disease.   Electronically Signed   By: Monte Fantasia M.D.   On: 05/14/2015 03:09     ASSESSMENT: 54 yo woman without significant PMH presenting with CP and elevated troponin c/w NSTEMI, now symptom free  PLAN/DISCUSSION: ASA, heparin, statin Low dose BB with hold parameters given slower rate Risk stratify NPO for cath in AM LV-gram vs Echo to assess LVF

## 2015-05-14 NOTE — ED Provider Notes (Signed)
CSN: [id number]     Arrival date & time 05/14/15  0206 History   First MD Initiated Contact with Patient 05/14/15 0210     Chief Complaint  Patient presents with  . Chest Pain     (Consider location/radiation/quality/duration/timing/severity/associated sxs/prior Treatment) HPI Comments: Patient is a 54 y/o female with a hx of anxiety. She presents to the ED today for c/o chest pain. Patient reports midsternal CP that began a few days ago while walking up steps at work. Pain was intermittent until this evening at [facility] when symptoms worsened. She reports that pain radiates to her b/l arms, but not past her elbows b/l. She took advil for pain which helped her symptoms a bit yesterday; this provided no relief of her pain this evening. She states that her pain is aggravated with activity such as walking. She had some nausea and 1 episode of emesis PTA. No fevers, syncope, lightheadedness, dizziness, shortness of breath, extremity numbness/weakness, leg swelling, recent travel, or surgeries. No hx of HTN, DM, HLD, ACS, or DVT/PE. FHx significant for ACS in father, grandfather and maternal grandmother; all were in their late 66's-80's when they had a heart attack.  Patient is a 54 y.o. female presenting with chest pain. The history is provided by the patient. No language interpreter was used.  Chest Pain Associated symptoms: nausea and vomiting   Associated symptoms: no abdominal pain, no dizziness, no fever, no shortness of breath and no weakness     Past Medical History  Diagnosis Date  . Anxiety   . Osteoporosis    Past Surgical History  Procedure Laterality Date  . Wisdom tooth extraction  1984   Family History  Problem Relation Age of Onset  . Colon cancer Cousin 73  . Heart disease Mother   . Heart disease Father   . COPD Father   . Breast cancer Maternal Aunt   . Diabetes Maternal Aunt   . Heart disease Paternal Aunt    Social History  Substance Use Topics  . Smoking status:  Never Smoker   . Smokeless tobacco: Never Used  . Alcohol Use: No   OB History    No data available      Review of Systems  Constitutional: Negative for fever.  Respiratory: Negative for shortness of breath.   Cardiovascular: Positive for chest pain. Negative for leg swelling.  Gastrointestinal: Positive for nausea and vomiting. Negative for abdominal pain.  Neurological: Negative for dizziness, syncope, weakness and light-headedness.  All other systems reviewed and are negative.   Allergies  Erythromycin; Floxin; and Macrobid  Home Medications   Prior to Admission medications   Medication Sig Start Date End Date Taking? Authorizing Provider  alendronate (FOSAMAX) 70 MG tablet  07/24/14   Historical Provider, MD  cholecalciferol (VITAMIN D) 1000 UNITS tablet Take 1,000 Units by mouth daily.    Historical Provider, MD  citalopram (CELEXA) 20 MG tablet  07/20/14   Historical Provider, MD   BP 152/84 mmHg  Pulse 64  Temp(Src) 98.2 F (36.8 C) (Oral)  Resp 20  Ht 5\' 4"  (1.626 m)  Wt 140 lb (63.504 kg)  BMI 24.02 kg/m2  SpO2 100%   Physical Exam  Constitutional: She is oriented to person, place, and time. She appears well-developed and well-nourished. No distress.  Nontoxic/nonseptic appearing  HENT:  Head: Normocephalic and atraumatic.  Eyes: Conjunctivae and EOM are normal. No scleral icterus.  Neck: Normal range of motion.  No JVD  Cardiovascular: Normal rate, regular rhythm and  intact distal pulses.   Pulmonary/Chest: Effort normal and breath sounds normal. No respiratory distress. She has no wheezes. She has no rales.  Respirations even and unlabored. Lungs clear.  Abdominal: Soft. She exhibits no distension. There is tenderness. There is no rebound and no guarding.  Mild focal tenderness in the epigastric region on deep palpation only. Abdomen soft and without masses. No peritoneal signs.  Musculoskeletal: Normal range of motion.  Neurological: She is alert and  oriented to person, place, and time. She exhibits normal muscle tone. Coordination normal.  GCS 15. Patient moving all extremities.  Skin: Skin is warm and dry. No rash noted. She is not diaphoretic. No erythema. No pallor.  Psychiatric: She has a normal mood and affect. Her behavior is normal.  Nursing note and vitals reviewed.   ED Course  Procedures (including critical care time) [place], ED - Abnormal; Notable for the following:    Troponin i, poc 2.19 (*)    All other components within normal limits  CBC WITH DIFFERENTIAL/PLATELET  COMPREHENSIVE METABOLIC PANEL  LIPASE, BLOOD  TROPONIN I  PROTIME-INR  HEPARIN LEVEL (UNFRACTIONATED)    Imaging Review Dg Chest 2 View  05/14/2015   CLINICAL DATA:  Chest pain and nausea for 3 days  EXAM: CHEST  2 VIEW  COMPARISON:  None.  FINDINGS: Normal heart size and mediastinal contours. No acute infiltrate or edema. No effusion or pneumothorax. No acute osseous findings.  IMPRESSION: No active cardiopulmonary disease.   Electronically Signed   By: Monte Fantasia M.D.   On: 05/14/2015 03:09     I have personally reviewed and evaluated these images and lab results as part of my medical decision-making.   EKG Interpretation   Date/Time:  Friday May 14 2015 02:13:27 EDT Ventricular Rate:  64 PR Interval:  125 QRS Duration: 81 QT Interval:  446 QTC Calculation: 460 R Axis:   88 Text Interpretation:  Sinus rhythm No old tracing to compare Confirmed by  CAMPOS  MD, Lennette Bihari ([id number]) on 05/14/2015 2:19:33 AM       CRITICAL CARE Performed by: Antonietta Breach   Total critical care time: 40  Critical care time was exclusive of separately billable procedures and treating other patients.  Critical care was necessary to treat or prevent imminent or life-threatening deterioration.  Critical care was time spent personally by me on the following activities: development of treatment plan with patient and/or  surrogate as well as nursing, discussions with consultants, evaluation of patient's response to treatment, examination of patient, obtaining history from patient or surrogate, ordering and performing treatments and interventions, ordering and review of laboratory studies, ordering and review of radiographic studies, pulse oximetry and re-evaluation of patient's condition.  MDM   Final diagnoses:  NSTEMI (non-ST elevated myocardial infarction)    Patient with chest pain radiating to b/l upper arms, constant since 8PM tonight. Troponin 2.19; no STEMI on EKG x 2. Case discussed with Dr. Philbert Riser of cardiology who will evaluate and admit. Dr. Philbert Riser requests holding additional morphine and to try and control pain with a Nitro gtt; orders placed. Patient hemodynamically stable at this time.   Filed Vitals:   05/14/15 0215 05/14/15 0216  BP: 152/84 152/84  Pulse: 62 64  Temp:  98.2 F (36.8 C)  TempSrc:  Oral  Resp: 23 20  Height:  5\' 4"  (1.626 m)  Weight:  140 lb (63.504 kg)  SpO2: 100% 100%     Antonietta Breach, PA-C 05/14/15  [place], PA-C 05/14/15 [place], MD 05/14/15 [place], MD 05/14/15 [phone]

## 2015-05-14 NOTE — ED Notes (Signed)
MD at bedside. 

## 2015-05-14 NOTE — Progress Notes (Signed)
Reviewed cath with patient. Troponin elevated. No chest pain since admission. EKG with non-specific changes. Risks of cath reviewed with patient. She wishes to proceed. Cath orders placed. Cath around 10:00 am today. Pt is NPO.   MCALHANY,CHRISTOPHER 05/14/2015 7:29 AM

## 2015-05-14 NOTE — ED Notes (Signed)
PA at bedside.

## 2015-05-14 NOTE — Interval H&P Note (Signed)
History and Physical Interval Note:  05/14/2015 9:33 AM  Brenda Chavez  has presented today for cardiac cath with the diagnosis of NSTEMI. The various methods of treatment have been discussed with the patient and family. After consideration of risks, benefits and other options for treatment, the patient has consented to  Procedure(s): Left Heart Cath and Coronary Angiography (N/A) as a surgical intervention .  The patient's history has been reviewed, patient examined, no change in status, stable for surgery.  I have reviewed the patient's chart and labs.  Questions were answered to the patient's satisfaction.    Cath Lab Visit (complete for each Cath Lab visit)  Clinical Evaluation Leading to the Procedure:   ACS: Yes.    Non-ACS:    Anginal Classification: CCS IV  Anti-ischemic medical therapy: No Therapy  Non-Invasive Test Results: No non-invasive testing performed  Prior CABG: No previous CABG         MCALHANY,CHRISTOPHER

## 2015-05-14 NOTE — Progress Notes (Addendum)
ANTICOAGULATION CONSULT NOTE - Initial Consult  Pharmacy Consult for Heparin Indication: chest pain/ACS  Allergies  Allergen Reactions  . Erythromycin Nausea And Vomiting  . Floxin [Ofloxacin]     Unable to close eyes  . Macrobid [Nitrofurantoin Macrocrystal] Nausea And Vomiting    Patient Measurements: Height: 5\' 4"  (162.6 cm) Weight: 140 lb (63.504 kg) IBW/kg (Calculated) : 54.7  Vital Signs: Temp: 98.2 F (36.8 C) (08/19 0216) Temp Source: Oral (08/19 0216) BP: 152/84 mmHg (08/19 0216) Pulse Rate: 64 (08/19 0216)  Labs: No results for input(s): HGB, HCT, PLT, APTT, LABPROT, INR, HEPARINUNFRC, CREATININE, CKTOTAL, CKMB, TROPONINI in the last 72 hours.  CrCl cannot be calculated (Patient has no serum creatinine result on file.).   Medical History: Past Medical History  Diagnosis Date  . Anxiety   . Osteoporosis     Medications:  Fosamax  Vit D  Celexa  Assessment: 54 y.o. female with chest pain for heparin   Goal of Therapy:  Heparin level 0.3-0.7 units/ml Monitor platelets by anticoagulation protocol: Yes   Plan:  Heparin 4000 units IV bolus, then start heparin 750 units/hr  Keeanna Villafranca, Bronson Curb 05/14/2015,2:58 AM

## 2015-05-14 NOTE — Research (Signed)
Brenda Chavez consented to participate in the [facility]IL[facility] Research Study.  Per protocol she will be taking ticagrelor 90 mg bid and ASA 81 mg daily for 3 months.  At 3 months she will be randomized to either continue on the ticagrelor and ASA or ticagrelor alone.  She has been given verbal and written instructions for the study and she has been given open label ticagrelor and ASA. The ticagrelor and ASA will be provided by the research study as long as she is still participating. Subject was given telephone numbers of the research staff should she have any questions.  Her first follow-up telephone call will be in 1 month. Patient verbalized understanding of her research study instructions.  She will not begin study drug until after discharge. She should receive her ticagrelor and ASA as prescribed during her hospitalization.

## 2015-05-14 NOTE — Research (Signed)
TWILIGHT Research Study Informed Consent   Subject Name: Brenda Chavez  Subject met inclusion and exclusion criteria.  The informed consent form, study requirements and expectations were reviewed with the subject and questions and concerns were addressed prior to the signing of the consent form.  The subject verbalized understanding of the trial requirements.  The subject agreed to participate in the [facility] and signed the informed consent on 05/14/2015 at 1630.  The informed consent was obtained prior to performance of any protocol-specific procedures for the subject.  A copy of the signed informed consent was given to the subject and a copy was placed in the subject's medical record.  Blossom Hoops 05/14/2015, 5:12 PM

## 2015-05-15 ENCOUNTER — Encounter (HOSPITAL_COMMUNITY): Payer: Self-pay | Admitting: Cardiology

## 2015-05-15 DIAGNOSIS — E785 Hyperlipidemia, unspecified: Secondary | ICD-10-CM | POA: Diagnosis present

## 2015-05-15 DIAGNOSIS — Z95820 Peripheral vascular angioplasty status with implants and grafts: Secondary | ICD-10-CM

## 2015-05-15 DIAGNOSIS — I2510 Atherosclerotic heart disease of native coronary artery without angina pectoris: Secondary | ICD-10-CM | POA: Diagnosis present

## 2015-05-15 HISTORY — DX: Hyperlipidemia, unspecified: E78.5

## 2015-05-15 LAB — BASIC METABOLIC PANEL
Anion gap: 7 (ref 5–15)
BUN: 14 mg/dL (ref 6–20)
CO2: 24 mmol/L (ref 22–32)
Calcium: 8.9 mg/dL (ref 8.9–10.3)
Chloride: 109 mmol/L (ref 101–111)
Creatinine, Ser: 0.8 mg/dL (ref 0.44–1.00)
GFR calc Af Amer: >60 mL/min (ref >60)
GFR calc non Af Amer: >60 mL/min (ref >60)
Glucose, Bld: 119 mg/dL — ABNORMAL HIGH (ref 65–99)
Potassium: 4.7 mmol/L (ref 3.5–5.1)
Sodium: 140 mmol/L (ref 135–145)

## 2015-05-15 LAB — CBC
HCT: 41.8 % (ref 36.0–46.0)
Hemoglobin: 13.5 g/dL (ref 12.0–15.0)
MCH: 30.8 pg (ref 26.0–34.0)
MCHC: 32.3 g/dL (ref 30.0–36.0)
MCV: 95.4 fL (ref 78.0–100.0)
Platelets: 224 10*3/uL (ref 150–400)
RBC: 4.38 MIL/uL (ref 3.87–5.11)
RDW: 14 % (ref 11.5–15.5)
WBC: 6.5 10*3/uL (ref 4.0–10.5)

## 2015-05-15 LAB — HEMOGLOBIN A1C
Hgb A1c MFr Bld: 6 % — ABNORMAL HIGH (ref 4.8–5.6)
Mean Plasma Glucose: 126 mg/dL

## 2015-05-15 MED ORDER — TICAGRELOR 90 MG PO TABS: 90.0000 mg | ORAL_TABLET | Freq: Two times a day (BID) | ORAL | Status: DC

## 2015-05-15 MED ORDER — ACETAMINOPHEN 325 MG PO TABS: 650.0000 mg | ORAL_TABLET | ORAL | Status: DC | PRN

## 2015-05-15 MED ORDER — ASPIRIN 81 MG PO TBEC: 81.0000 mg | DELAYED_RELEASE_TABLET | Freq: Every day | ORAL | Status: DC

## 2015-05-15 MED ORDER — METOPROLOL TARTRATE 25 MG PO TABS: 12.5000 mg | ORAL_TABLET | Freq: Two times a day (BID) | ORAL | Status: DC

## 2015-05-15 MED ORDER — NITROGLYCERIN 0.4 MG SL SUBL: 0.4000 mg | SUBLINGUAL_TABLET | SUBLINGUAL | Status: DC | PRN

## 2015-05-15 MED ORDER — ATORVASTATIN CALCIUM 80 MG PO TABS: 80.0000 mg | ORAL_TABLET | Freq: Every day | ORAL | Status: DC

## 2015-05-15 NOTE — Progress Notes (Signed)
Utilization review completed.  

## 2015-05-15 NOTE — Care Management Note (Signed)
Case Management Note  Patient Details  Name: Brenda Chavez MRN: [id number] Date of Birth: 06/14/1961  Subjective/Objective:                   chest pain. Action/Plan:  Discharge planning Expected Discharge Date:  05/15/15               Expected Discharge Plan:  Home/Self Care  In-House Referral:     Discharge planning Services  CM Consult, Medication Assistance  Post Acute Care Choice:    Choice offered to:     DME Arranged:    DME Agency:     HH Arranged:    HH Agency:     Status of Service:  Completed, signed off  Medicare Important Message Given:    Date Medicare IM Given:    Medicare IM give by:    Date Additional Medicare IM Given:    Additional Medicare Important Message give by:     If discussed at Long Length of Stay Meetings, dates discussed:    Additional Comments: CM met with pt in roomto give pt a free 30 day trial card/brilinta book.   Pt is in 90 day free study which provides the Brilinta free for 90 days.  No other CM needs were communicated. Jeffries, Sarah Christine, RN 05/15/2015, 9:11 AM  

## 2015-05-15 NOTE — Progress Notes (Signed)
Patient ambulated 200 ft without assistant. No c/o pain or SOB

## 2015-05-15 NOTE — Discharge Instructions (Signed)
Your lab was noted for elevated glucose, watch your diet closely decrease sweets, decrease white bread better to use whole wheat- follow up with your primary MD.  Call [facility] at [phone] if any bleeding, swelling or drainage at cath site.  May shower, no tub baths for 48 hours for groin sticks. No lifting over 5 pounds for 5 days.  No Driving for 5 days.  No work for a week.   Take 1 NTG, under your tongue, while sitting.  If no relief of pain may repeat NTG, one tab every 5 minutes up to 3 tablets total over 15 minutes.  If no relief CALL 911.  If you have dizziness/lightheadness  while taking NTG, stop taking and call 911.        Call for any questions or problems.  Do Not Stop Brilinta, ASPRIN stopping could cause a heart attack.

## 2015-05-15 NOTE — Progress Notes (Deleted)
Patient ambulated 500 ft without assistant. No c/o pain or SOB

## 2015-05-15 NOTE — Discharge Summary (Signed)
Physician Discharge Summary       Patient ID: Brenda Chavez MRN: [id number] DOB/AGE: 10-27-1960 54 y.o.  Admit date: 05/14/2015 Discharge date: 05/15/2015 Primary Cardiologist: Dr. Angelena Form   Discharge Diagnoses:  Principal Problem:   NSTEMI (non-ST elevated myocardial infarction) Active Problems:   Hyperlipidemia LDL goal <70   CAD in native artery   S/P angioplasty with stent, 05/14/15 ost. 1st diag with DES   Discharged Condition: good  Procedures: 05/14/15 cardiac cath and PCI with DES to ostial 1st diag. Promus DES by Dr. Angelena Form.  Hospital Course:   54 yo woman with anxiety and osteoporosis who presents to ED with chest pain 05/14/15. Initial episode over the weekend while working in garden, exerting herself in the heat. She thought she had overexerted herself and pain resolved quickly with rest so didn't pay much attention to it. She had another episode while at work while going up and down stairs with co-workers for exercise, this too resolved with rest. However,  Night before admit around 10 pm pain started and did not improve prompting ED visit around 2 am. In ED she received ASA, NTG and MSO4 with resolution of pain. ECG without ischemic changes but iSTAT troponin 2.  She was admitted with NSTEMI  Placed on IV heparin, statin, and low dose BB.  Underwent cardiac cath later that AM.  10%% stenosed of 1st diag at ostium and underwent PCI with DES- Promus stent. Normal LV function.   By the next AM pt was without pain and ambulated with cardiac rehab.  She did have brief sinus pauses that were asymptomatic.  She is on low dose BB.  She will follow up in the office in 1-2 weeks with APP or Dr. Angelena Form.  Pk troponin 2.19.  Discharged with BB, statin, ASA and Brilinta, is in [place].  Brilinta provided and ASA provided by Research.  Her HGBA1C was elevated and she will follow up with her PCP, and monitor diet more closely.     Consults: cardiology  Significant  Diagnostic Studies:  BMP Latest Ref Rng 05/15/2015 05/14/2015 05/14/2015  Glucose 65 - 99 mg/dL 119(H) 123(H) 115(H)  BUN 6 - 20 mg/dL 14 15 15   Creatinine 0.44 - 1.00 mg/dL 0.80 0.93 0.92  Sodium 135 - 145 mmol/L 140 142 142  Potassium 3.5 - 5.1 mmol/L 4.7 4.5 3.7  Chloride 101 - 111 mmol/L 109 104 103  CO2 22 - 32 mmol/L 24 28 27   Calcium 8.9 - 10.3 mg/dL 8.9 9.2 9.6   CBC Latest Ref Rng 05/15/2015 05/14/2015 05/14/2015  WBC 4.0 - 10.5 K/uL 6.5 8.7 7.4  Hemoglobin 12.0 - 15.0 g/dL 13.5 13.5 14.2  Hematocrit 36.0 - 46.0 % 41.8 41.8 42.7  Platelets 150 - 400 K/uL 224 246 247   Troponin 2.19  Lipid Panel     Component Value Date/Time   CHOL 181 05/14/2015 0632   TRIG 57 05/14/2015 0632   HDL 68 05/14/2015 0632   CHOLHDL 2.7 05/14/2015 0632   VLDL 11 05/14/2015 0632   LDLCALC 102* 05/14/2015 0632   BNP 118.9  HGBA1C, 6.0    TSH 2.251  CHEST 2 VIEW COMPARISON: None. FINDINGS: Normal heart size and mediastinal contours. No acute infiltrate or edema. No effusion or pneumothorax. No acute osseous findings. IMPRESSION: No active cardiopulmonary disease.   EKG at discharge: Normal sinus rhythm Septal infarct , age undetermined Abnormal ECG- though no change from admit  CARDIAC CATH: PCI Note: The patient was given 180 mg  Brilinta po x 1. She was given a weight based bolus of Angiomax and a drip was started. When the ACT was over 250, I engaged the left main with a XB LAD 3.5 guiding catheter. A Cougar IC wire was advanced down the LAD into the Diagonal branch. A 2.0 x 12 mm balloon was used to pre-dilate the occluded branch. Flow was restored. A 2.25 x 12 mm Promus Premier DES was deployed in the ostium of the Diagonal branch. The stent was post-dilated with a 2.5 x 8 mm [place] balloon x 1. The stenosis was taken from 100% down to 0%. TIMI 0 flow pre/TIMI 3 flow post stent   Angioseal placed right femoral artery.   There were no immediate complications. The patient was taken  to the recovery area in stable condition.   EBL: 15 cc    Conclusion     Dist LAD lesion, 30% stenosed.  Ost 1st Diag to 1st Diag lesion, 100% stenosed. There is a 0% residual stenosis post intervention.  A drug-eluting stent was placed.  There is mild left ventricular systolic dysfunction.  1. NSTEMI secondary to thrombotic occlusion of the moderate caliber Diagonal branch 2. Successful PTCA/DES x 1 Diagonal branch 3. Normal LV systolic function  Recommendations: One year of dual anti-platelet therapy with ASA and Brilinta. Continue beta blocker and statin. Home in am if stable. Would start Ace-inh given MI and LV dysfunction if BP can tolerate.       Discharge Exam: Blood pressure 110/51, pulse 64, temperature 98.3 F (36.8 C), temperature source Oral, resp. rate 15, height 5\' 4"  (1.626 m), weight 144 lb 13.5 oz (65.7 kg), SpO2 100 %.  Disposition: HOME      Discharge Instructions    Amb Referral to Cardiac Rehabilitation    Complete by:  As directed   Congestive Heart Failure: If diagnosis is Heart Failure, patient MUST meet each of the CMS criteria: 1. Left Ventricular Ejection Fraction </= 35% 2. NYHA class II-IV symptoms despite being on optimal heart failure therapy for at least 6 weeks. 3. Stable = have not had a recent (<6 weeks) or planned (<6 months) major cardiovascular hospitalization or procedure  Program Details: - Physician supervised classes - 1-3 classes per week over a 12-18 week period, generally for a total of 36 sessions  Physician Certification: I certify that the above Cardiac Rehabilitation treatment is medically necessary and is medically approved by me for treatment of this patient. The patient is willing and cooperative, able to ambulate and medically stable to participate in exercise rehabilitation. The participant's progress and Individualized Treatment Plan will be reviewed by the Medical Director, Cardiac Rehab staff and as indicated by  the Referring/Ordering Physician.  Diagnosis:   Myocardial Infarction PCI              Medication List    TAKE these medications        acetaminophen 325 MG tablet  Commonly known as:  TYLENOL  Take 2 tablets (650 mg total) by mouth every 4 (four) hours as needed for headache or mild pain.     alendronate 70 MG tablet  Commonly known as:  FOSAMAX  Take 70 mg by mouth once a week.     aspirin 81 MG EC tablet  Take 1 tablet (81 mg total) by mouth daily.     atorvastatin 80 MG tablet  Commonly known as:  LIPITOR  Take 1 tablet (80 mg total) by mouth daily at 6 PM.  cholecalciferol 1000 UNITS tablet  Commonly known as:  VITAMIN D  Take 1,000 Units by mouth daily.     citalopram 20 MG tablet  Commonly known as:  CELEXA  Take 20 mg by mouth daily.     metoprolol tartrate 25 MG tablet  Commonly known as:  LOPRESSOR  Take 0.5 tablets (12.5 mg total) by mouth 2 (two) times daily.     nitroGLYCERIN 0.4 MG SL tablet  Commonly known as:  NITROSTAT  Place 1 tablet (0.4 mg total) under the tongue every 5 (five) minutes x 3 doses as needed for chest pain.     ticagrelor 90 MG Tabs tablet  Commonly known as:  BRILINTA  Take 1 tablet (90 mg total) by mouth 2 (two) times daily.       Follow-up Information    Follow up with Lauree Chandler, MD.   Specialty:  Cardiology   Why:  the office should call you on Monday with follow up appt in 1-2 weeks with PA/NP   Contact information:   [place] [place] [address] [phone]        Discharge Instructions: Your lab was noted for elevated glucose, watch your diet closely decrease sweets, decrease white bread better to use whole wheat- follow up with your primary MD.  Call [facility] at [phone] if any bleeding, swelling or drainage at cath site.  May shower, no tub baths for 48 hours for groin sticks. No lifting over 5 pounds for 5 days.  No Driving for 5 days.  No work  for a week.   Take 1 NTG, under your tongue, while sitting.  If no relief of pain may repeat NTG, one tab every 5 minutes up to 3 tablets total over 15 minutes.  If no relief CALL 911.  If you have dizziness/lightheadness  while taking NTG, stop taking and call 911.        Call for any questions or problems.  Do Not Stop Brilinta, ASPRIN stopping could cause a heart attack.     Signed: Isaiah Serge Nurse Practitioner-Certified [facility]: [facility] 05/15/2015, 9:30 AM  Time spent on discharge : >30 minutes.

## 2015-05-15 NOTE — Progress Notes (Signed)
CARDIAC REHAB PHASE I   PRE:  Rate/Rhythm: 69 NSR  BP:  Sitting: 124/74      SaO2: 100 RA  MODE:  Ambulation: 450 ft   POST:  Rate/Rhythm: 71 NSR  BP:  Sitting: 131/73     SaO2: 99 RA [id number] Patient ambulated in hallway independently accompanied by husband and cardiac rehab RN. Steady gait noted. Patient denied complaints. Post ambulation patient back to bedside sitting with call bell in reach. Discharge education completed with emphlysis on MI booklet, activity progression, antiplatelet therapy, and nitro use. Teach back noted. Patient is enrolled in a medication study and verbalizes understanding of medication use. Patient is still employed full time but expresses interest in phase II cardiac rehab. With patients permission order placed.  Santina Evans, BSN 05/15/2015 9:28 AM

## 2015-05-15 NOTE — Progress Notes (Signed)
54 yo woman with anxiety and osteoporosis who presented to ED with chest pain, Troponin + ,Her initial episodes were described as throbbing pain, center chest with associated bilateral arm discomfort. Tonight, it was more burning in quality with similar arm discomfort and associated with nausea and 1 emesis.  Subjective: No pain, no SOB   Objective: Vital signs in last 24 hours: Temp:  [97.9 F (36.6 C)-98.3 F (36.8 C)] 98.3 F (36.8 C) (08/20 0723) Pulse Rate:  [62-183] 64 (08/20 0723) Resp:  [0-30] 15 (08/20 0723) BP: (90-141)/(51-92) 110/51 mmHg (08/20 0723) SpO2:  [0 %-100 %] 100 % (08/20 0723) Weight:  [144 lb 13.5 oz (65.7 kg)] 144 lb 13.5 oz (65.7 kg) (08/20 0248) Weight change: 4 lb 13.5 oz (2.197 kg) Last BM Date: 05/14/15 Intake/Output from previous day: -200 08/19 0701 - 08/20 0700 In: 900 [P.O.:600; I.V.:300] Out: 1100 [Urine:1100] Intake/Output this shift:    PE: General:Pleasant affect, NAD Skin:Warm and dry, brisk capillary refill HEENT:normocephalic, sclera clear, mucus membranes moist Heart:S1S2 RRR without murmur, gallup, rub or click Lungs:clear without rales, rhonchi, or wheezes OFH:QRFX, non tender, + BS, do not palpate liver spleen or masses Ext:no lower ext edema, 2+ pedal pulses, 2+ radial pulses Neuro:alert and oriented X 3, MAE, follows commands, + facial symmetry Tele: SR with sinus pauses EKG SR with flipped T wave in AVL, same as on admit.   Lab Results:  Recent Labs  05/14/15 0632 05/15/15 0259  WBC 8.7 6.5  HGB 13.5 13.5  HCT 41.8 41.8  PLT 246 224   BMET  Recent Labs  05/14/15 0632 05/15/15 0259  NA 142 140  K 4.5 4.7  CL 104 109  CO2 28 24  GLUCOSE 123* 119*  BUN 15 14  CREATININE 0.93 0.80  CALCIUM 9.2 8.9    Recent Labs  05/14/15 0221  TROPONINI 2.12*    Lab Results  Component Value Date   CHOL 181 05/14/2015   HDL 68 05/14/2015   LDLCALC 102* 05/14/2015   TRIG 57 05/14/2015   CHOLHDL 2.7  05/14/2015   Lab Results  Component Value Date   HGBA1C 6.0* 05/14/2015     Lab Results  Component Value Date   TSH 2.251 05/14/2015    Hepatic Function Panel  Recent Labs  05/14/15 0221  PROT 6.8  ALBUMIN 4.3  AST 60*  ALT 30  ALKPHOS 63  BILITOT 0.7    Recent Labs  05/14/15 0632  CHOL 181   No results for input(s): PROTIME in the last 72 hours.     Studies/Results: Dg Chest 2 View  05/14/2015   CLINICAL DATA:  Chest pain and nausea for 3 days  EXAM: CHEST  2 VIEW  COMPARISON:  None.  FINDINGS: Normal heart size and mediastinal contours. No acute infiltrate or edema. No effusion or pneumothorax. No acute osseous findings.  IMPRESSION: No active cardiopulmonary disease.   Electronically Signed   By: Monte Fantasia M.D.   On: 05/14/2015 03:09    Medications: I have reviewed the patient's current medications. Scheduled Meds: . aspirin EC  81 mg Oral Daily  . atorvastatin  80 mg Oral q1800  . citalopram  20 mg Oral Daily  . metoprolol tartrate  12.5 mg Oral BID  . sodium chloride  3 mL Intravenous Q12H  . ticagrelor  90 mg Oral BID   Continuous Infusions:  PRN Meds:.sodium chloride, acetaminophen, nitroGLYCERIN, ondansetron (ZOFRAN) IV, sodium chloride, zolpidem   Cardiac cath:  Dist LAD lesion, 30% stenosed.  Ost 1st Diag to 1st Diag lesion, 100% stenosed. There is a 0% residual stenosis post intervention.  A drug-eluting stent was placed.  There is mild left ventricular systolic dysfunction.  1. NSTEMI secondary to thrombotic occlusion of the moderate caliber Diagonal branch 2. Successful PTCA/DES x 1 Diagonal branch 3. Normal LV systolic function  Recommendations: One year of dual anti-platelet therapy with ASA and Brilinta. Continue beta blocker and statin. Home in am if stable. Would start Ace-inh given MI and LV dysfunction if BP can tolerate.  Assessment/Plan: Principal Problem:   NSTEMI (non-ST elevated myocardial infarction) on ASA, BB,  statin, brilinta Active Problems:   Hyperlipidemia LDL goal <70- lipitor 80 daily   CAD in native artery   S/P angioplasty with stent, 05/14/15 ost. 1st diag with DES- on asa brilinta--in TWILIGHT research study.      --will be taking ticagrelor 90 mg bid and ASA 81 mg daily for 3 months. At 3 months she will be randomized to either continue on the ticagrelor and ASA or ticagrelor alone   Sinus Pauses-  freq but short, asymptomatic, instructed to call if any lightheadedness   Return to work 1 or 2 weeks?  No driving for a week.   Discharge after ambulation with rehab if no issues.  LOS: 1 day   Time spent with pt. : 15 minutes. [facility]  Nurse Practitioner Certified Pager [phone] or after 5pm and on weekends call [phone] 05/15/2015, 7:30 AM  As above, patient seen and examined. She denies chest pain. Radial cath site with no hematoma. Plan continue aspirin, brilinta, statin and metoprolol. Discharge today. Follow-up with Dr. Angelena Form or PA in 1-2 weeks >30 min PA and physician time [place]

## 2015-05-17 ENCOUNTER — Telehealth: Payer: Self-pay | Admitting: Cardiovascular Disease

## 2015-05-17 MED FILL — Lidocaine HCl Local Preservative Free (PF) Inj 1%: INTRAMUSCULAR | Qty: 30 | Status: AC

## 2015-05-17 NOTE — Telephone Encounter (Signed)
New Message   Pt want sot know what she can take over the counter?  Is she allowed to drink coffee, regular or decaf  Can she return to work  Pt has Dr appt with Jorja Loa on sept 2nd @ 8am

## 2015-05-17 NOTE — Telephone Encounter (Signed)
Discharge summary reviewed. Instructions to pt state no work for one week.  I reviewed with Dr. Angelena Form and if pt does not feel she can return to work in one week  this can be extended until follow up appt with Ignacia Bayley, NP on May 28, 2015. I placed call to pt and left message to call back

## 2015-05-18 NOTE — Telephone Encounter (Signed)
I spoke with the patient. I advised her of Dr. Camillia Herter recommendations in regards to returning to work. She inquired about coffee- she has not had any issues with heart arrhythmias/ palpitations- advised caffeine in moderation. She questioned if she could use NSAIDS- I advised she should not use this type of OTC medication. She inquired about coconut milk/ almond milk and was this ok for her to use with her coronary disease. I explained that this should probably be ok- higher fat content is probably in the actual "meat" of a coconut, but I would ask Dr. Camillia Herter opinion on this. She is agreeable.

## 2015-05-18 NOTE — Telephone Encounter (Signed)
The patient is aware she may use coconut/ almond milk.

## 2015-05-18 NOTE — Telephone Encounter (Signed)
Follow up      Returning Pat's call from yesterday

## 2015-05-18 NOTE — Telephone Encounter (Signed)
I agree with all of your recommendations. Thanks, chris

## 2015-05-26 ENCOUNTER — Other Ambulatory Visit: Payer: Self-pay | Admitting: *Deleted

## 2015-05-26 MED ORDER — AMBULATORY NON FORMULARY MEDICATION: 90.0000 mg | Freq: Two times a day (BID) | Status: DC

## 2015-05-26 MED ORDER — AMBULATORY NON FORMULARY MEDICATION: 81.0000 mg | Freq: Every day | Status: DC

## 2015-05-28 ENCOUNTER — Ambulatory Visit (INDEPENDENT_AMBULATORY_CARE_PROVIDER_SITE_OTHER): Payer: 59 | Admitting: Nurse Practitioner

## 2015-05-28 ENCOUNTER — Encounter: Payer: Self-pay | Admitting: Nurse Practitioner

## 2015-05-28 VITALS — BP 110/70 | HR 86 | Ht 64.0 in | Wt 136.0 lb

## 2015-05-28 DIAGNOSIS — I214 Non-ST elevation (NSTEMI) myocardial infarction: Secondary | ICD-10-CM

## 2015-05-28 DIAGNOSIS — I2510 Atherosclerotic heart disease of native coronary artery without angina pectoris: Secondary | ICD-10-CM

## 2015-05-28 DIAGNOSIS — I222 Subsequent non-ST elevation (NSTEMI) myocardial infarction: Secondary | ICD-10-CM | POA: Diagnosis not present

## 2015-05-28 DIAGNOSIS — E785 Hyperlipidemia, unspecified: Secondary | ICD-10-CM | POA: Diagnosis not present

## 2015-05-28 MED ORDER — METOPROLOL TARTRATE 25 MG PO TABS: 12.5000 mg | ORAL_TABLET | Freq: Two times a day (BID) | ORAL | Status: DC

## 2015-05-28 MED ORDER — ATORVASTATIN CALCIUM 80 MG PO TABS: 80.0000 mg | ORAL_TABLET | Freq: Every day | ORAL | Status: DC

## 2015-05-28 NOTE — Patient Instructions (Addendum)
Medication Instructions:  Your physician recommends that you continue on your current medications as directed. Please refer to the Current Medication list given to you today. 1. Lipitor ( 80 mg )  2. Metoprolol ( 12.5 mg ) twice a day were refilled at office visit today to Mail order  Labwork: Your physician recommends that you return for a FASTING lipid profile: on October 12 between 8-5   Testing/Procedures: -None  Follow-Up: -None  Any Other Special Instructions Will Be Listed Below (If Applicable).

## 2015-05-28 NOTE — Progress Notes (Signed)
Patient Name: Brenda Chavez Date of Encounter: 05/28/2015  Primary Care Provider:  Webb Silversmith, NP Primary Cardiologist:  C. Angelena Form, MD  Chief Complaint  54 year old female status post recent non-STEMI who presents for office follow-up.  Past Medical History   Past Medical History  Diagnosis Date  . Anxiety   . Osteoporosis   . Hyperlipidemia LDL goal <70 05/15/2015  . CAD (coronary artery disease)     a. 04/2015 NSTEMI/PCI: LM nl, LAD 30d, D1 100 (2.25x12 Promus Premier DES), D2 small, RI small, LCX nl, OM1/2 nl, RCA/PDA nl, EF 45-50%.   Past Surgical History  Procedure Laterality Date  . Wisdom tooth extraction  1984  . Cardiac catheterization N/A 05/14/2015    Procedure: Left Heart Cath and Coronary Angiography;  Surgeon: Burnell Blanks, MD;  Location: [place] [facility];  Service: Cardiovascular;  Laterality: N/A;  . Cardiac catheterization N/A 05/14/2015    Procedure: Coronary Stent Intervention;  Surgeon: Burnell Blanks, MD;  Location: [place] [facility];  Service: Cardiovascular;  Laterality: N/A;    Allergies  Allergies  Allergen Reactions  . Erythromycin Nausea And Vomiting  . Floxin [Ofloxacin]     Unable to close eyes  . Macrobid [Nitrofurantoin Macrocrystal] Nausea And Vomiting    HPI  54 year old female without prior cardiac history. She was recently admitted to [facility] secondary to exertional chest pain and ruled in for non-STEMI. She underwent diagnostic catheterization revealing severe first diagonal disease and this was successfully treated with a drug-eluting stent. She was subsequent discharged home. Since discharge, she has noted occasional mild chest tightness with higher levels of activity without associated dyspnea. Symptoms last a few minutes do not do silly stop her from doing what she is doing. Symptoms are similar in character but not nearly as severe as her presenting symptoms. She denies PND, orthopnea, dizziness, sig B,  edema, or early satiety.  Home Medications  Prior to Admission medications   Medication Sig Start Date End Date Taking? Authorizing Provider  acetaminophen (TYLENOL) 325 MG tablet Take 2 tablets (650 mg total) by mouth every 4 (four) hours as needed for headache or mild pain. 05/15/15  Yes Isaiah Serge, NP  alendronate (FOSAMAX) 70 MG tablet Take 70 mg by mouth once a week.  07/24/14  Yes Historical Provider, MD  AMBULATORY NON FORMULARY MEDICATION Take 90 mg by mouth 2 (two) times daily. Medication Name: Brilinta 90 mg BID (provided by the TWILIGHT Research Study Do Not fill) 05/26/15  Yes Burnell Blanks, MD  AMBULATORY NON FORMULARY MEDICATION Take 81 mg by mouth daily. Medication Name: ASA 81 mg daily (provoded by the [place] study) 05/26/15  Yes Burnell Blanks, MD  atorvastatin (LIPITOR) 80 MG tablet Take 1 tablet (80 mg total) by mouth daily at 6 PM. 05/28/15  Yes Rogelia Mire, NP  cholecalciferol (VITAMIN D) 1000 UNITS tablet Take 1,000 Units by mouth daily.   Yes Historical Provider, MD  citalopram (CELEXA) 20 MG tablet Take 20 mg by mouth daily.  07/20/14  Yes Historical Provider, MD  metoprolol tartrate (LOPRESSOR) 25 MG tablet Take 0.5 tablets (12.5 mg total) by mouth 2 (two) times daily. 05/28/15  Yes Rogelia Mire, NP  nitroGLYCERIN (NITROSTAT) 0.4 MG SL tablet Place 1 tablet (0.4 mg total) under the tongue every 5 (five) minutes x 3 doses as needed for chest pain. 05/15/15  Yes Isaiah Serge, NP    Review of Systems  As above, patient  has had intermittent exertional mild chest tightness. She denies PND, orthopnea, dizziness, syncope, edema, or early satiety.  All other systems reviewed and are otherwise negative except as noted above.  Physical Exam  VS:  BP 110/70 mmHg  Pulse 86  Ht 5\' 4"  (1.626 m)  Wt 136 lb (61.689 kg)  BMI 23.33 kg/m2 , BMI Body mass index is 23.33 kg/(m^2). GEN: Well nourished, well developed, in no acute  distress. HEENT: normal. Neck: Supple, no JVD, carotid bruits, or masses. Cardiac: RRR, no murmurs, rubs, or gallops. No clubbing, cyanosis, edema.  Radials/DP/PT 2+ and equal bilaterally. Right groin catheterization site is without bleeding, bruit, or hematoma. Respiratory:  Respirations regular and unlabored, clear to auscultation bilaterally. GI: Soft, nontender, nondistended, BS + x 4. MS: no deformity or atrophy. Skin: warm and dry, no rash. Neuro:  Strength and sensation are intact. Psych: Normal affect.  Accessory Clinical Findings  ECG - sinus bradycardia, 49, early repolarization, no acute ST or T changes.  Assessment & Plan  1.  Non-STEMI, subsequent episode of care/coronary artery disease: Status post PCI and stenting of the first diagonal. She has had intermittent, mild, chest tightness with high levels of exertion. The symptoms are somewhat similar in character to prior angina, they are not nearly as severe. Her ECG is not acutely changed today. I recommended continuing medical therapy including aspirin, Brilinta, statin and beta blocker. We will arrange for follow-up in approximately one month or sooner if necessary. If symptoms worsen she is to contact us. She is considering cardiac rehabilitation but is not sure that her work schedule will allow for it. Of note, she is enrolled in the twilight study.  2. Hyperlipidemia: Continue high potency statin therapy. She will need follow-up lipids and LFTs upon office follow-up.  3. Disposition: f/u with Dr. Angelena Form in 4-6 wks or sooner if necessary.  Murray Hodgkins, NP 05/28/2015, 7:24 PM

## 2015-06-11 ENCOUNTER — Encounter: Payer: Self-pay | Admitting: *Deleted

## 2015-06-11 DIAGNOSIS — Z006 Encounter for examination for normal comparison and control in clinical research program: Secondary | ICD-10-CM

## 2015-06-11 NOTE — Progress Notes (Signed)
TWILIGHT 1 month telephone follow up. Patient states 100 % compliant with ASA and brilinta, no adverse events noted some bruising noted no bleeding.  Re-educated on importance of brilinta ans ASA. Questions encouraged and answered. Next research appointment scheduled for 07/29/2015 @ 8 for Randomization.

## 2015-06-17 ENCOUNTER — Telehealth (HOSPITAL_COMMUNITY): Payer: Self-pay | Admitting: *Deleted

## 2015-06-25 ENCOUNTER — Encounter: Payer: Self-pay | Admitting: Nurse Practitioner

## 2015-07-07 ENCOUNTER — Other Ambulatory Visit (INDEPENDENT_AMBULATORY_CARE_PROVIDER_SITE_OTHER): Payer: 59

## 2015-07-07 ENCOUNTER — Ambulatory Visit: Payer: 59 | Admitting: Cardiovascular Disease

## 2015-07-07 DIAGNOSIS — E785 Hyperlipidemia, unspecified: Secondary | ICD-10-CM

## 2015-07-07 DIAGNOSIS — I214 Non-ST elevation (NSTEMI) myocardial infarction: Secondary | ICD-10-CM

## 2015-07-07 LAB — HEPATIC FUNCTION PANEL
ALT: 35 U/L — ABNORMAL HIGH (ref 6–29)
AST: 27 U/L (ref 10–35)
Albumin: 4.4 g/dL (ref 3.6–5.1)
Alkaline Phosphatase: 62 U/L (ref 33–130)
Bilirubin, Direct: 0.2 mg/dL (ref <=0.2)
Indirect Bilirubin: 0.4 mg/dL (ref 0.2–1.2)
Total Bilirubin: 0.6 mg/dL (ref 0.2–1.2)
Total Protein: 7.1 g/dL (ref 6.1–8.1)

## 2015-07-07 LAB — LIPID PANEL
Cholesterol: 100 mg/dL — ABNORMAL LOW (ref 125–200)
HDL: 67 mg/dL (ref >=46)
LDL Cholesterol: 17 mg/dL (ref <130)
Total CHOL/HDL Ratio: 1.5 Ratio (ref <=5.0)
Triglycerides: 79 mg/dL (ref <150)
VLDL: 16 mg/dL (ref <30)

## 2015-07-15 ENCOUNTER — Ambulatory Visit (INDEPENDENT_AMBULATORY_CARE_PROVIDER_SITE_OTHER): Payer: 59 | Admitting: Cardiovascular Disease

## 2015-07-15 ENCOUNTER — Encounter: Payer: Self-pay | Admitting: Cardiovascular Disease

## 2015-07-15 VITALS — BP 110/62 | HR 57 | Ht 64.0 in | Wt 136.0 lb

## 2015-07-15 DIAGNOSIS — I2510 Atherosclerotic heart disease of native coronary artery without angina pectoris: Secondary | ICD-10-CM

## 2015-07-15 DIAGNOSIS — E785 Hyperlipidemia, unspecified: Secondary | ICD-10-CM

## 2015-07-15 NOTE — Progress Notes (Signed)
Chief Complaint  Patient presents with  . Follow-up    NSTEMI    History of Present Illness: 54 year old female with h/o CAD, HLD, anxiety who is here today for cardiac follow up. She was recently admitted to [facility] 05/14/15 with a NSTEMI.  Cardiac catheterization showed severe first diagonal disease and this was successfully treated with a drug-eluting stent.   She is here today for follow up. She feels great. No chest pain, SOB, fatigue.   Primary Care Physician: Webb Silversmith   Last Lipid Profile:Lipid Panel     Component Value Date/Time   CHOL 100* 07/07/2015 0813   TRIG 79 07/07/2015 0813   HDL 67 07/07/2015 0813   CHOLHDL 1.5 07/07/2015 0813   VLDL 16 07/07/2015 0813   LDLCALC 17 07/07/2015 0813     Past Medical History  Diagnosis Date  . Anxiety   . Osteoporosis   . Hyperlipidemia LDL goal <70 05/15/2015  . CAD (coronary artery disease)     a. 04/2015 NSTEMI/PCI: LM nl, LAD 30d, D1 100 (2.25x12 Promus Premier DES), D2 small, RI small, LCX nl, OM1/2 nl, RCA/PDA nl, EF 45-50%.    Past Surgical History  Procedure Laterality Date  . Wisdom tooth extraction  1984  . Cardiac catheterization N/A 05/14/2015    Procedure: Left Heart Cath and Coronary Angiography;  Surgeon: Burnell Blanks, MD;  Location: [place] [facility];  Service: Cardiovascular;  Laterality: N/A;  . Cardiac catheterization N/A 05/14/2015    Procedure: Coronary Stent Intervention;  Surgeon: Burnell Blanks, MD;  Location: [place] [facility];  Service: Cardiovascular;  Laterality: N/A;    Current Outpatient Prescriptions  Medication Sig Dispense Refill  . acetaminophen (TYLENOL) 325 MG tablet Take 2 tablets (650 mg total) by mouth every 4 (four) hours as needed for headache or mild pain.    Marland Kitchen alendronate (FOSAMAX) 70 MG tablet Take 70 mg by mouth once a week.     . AMBULATORY NON FORMULARY MEDICATION Take 81 mg by mouth daily. Medication Name: ASA 81 mg daily (provoded by the  [employer] study)    . aspirin 81 MG tablet Take 81 mg by mouth daily.    Marland Kitchen atorvastatin (LIPITOR) 80 MG tablet Take 1 tablet (80 mg total) by mouth daily at 6 PM. 90 tablet 3  . cholecalciferol (VITAMIN D) 1000 UNITS tablet Take 1,000 Units by mouth daily.    . citalopram (CELEXA) 20 MG tablet Take 20 mg by mouth daily.     . metoprolol tartrate (LOPRESSOR) 25 MG tablet Take 0.5 tablets (12.5 mg total) by mouth 2 (two) times daily. 90 tablet 3  . nitroGLYCERIN (NITROSTAT) 0.4 MG SL tablet Place 1 tablet (0.4 mg total) under the tongue every 5 (five) minutes x 3 doses as needed for chest pain. 25 tablet 4  . ticagrelor (BRILINTA) 90 MG TABS tablet Take 90 mg by mouth 2 (two) times daily.     No current facility-administered medications for this visit.    Allergies  Allergen Reactions  . Erythromycin Nausea And Vomiting  . Floxin [Ofloxacin] Other (See Comments)    Unable to close eyes  . Macrobid [Nitrofurantoin Macrocrystal] Nausea And Vomiting    Social History   Social History  . Marital Status: Married    Spouse Name: N/A  . Number of Children: N/A  . Years of Education: N/A   Occupational History  . Not on file.   Social History Main Topics  . Smoking status:  Never Smoker   . Smokeless tobacco: Never Used  . Alcohol Use: No  . Drug Use: No  . Sexual Activity: Yes    Birth Control/ Protection: Post-menopausal   Other Topics Concern  . Not on file   Social History Narrative    Family History  Problem Relation Age of Onset  . Colon cancer Cousin 46  . Heart disease Mother   . Heart disease Father   . COPD Father   . Breast cancer Maternal Aunt   . Diabetes Maternal Aunt   . Heart disease Paternal Aunt   . Heart attack Maternal Grandmother   . Heart attack Paternal Grandmother     Review of Systems:  As stated in the HPI and otherwise negative.   BP 110/62 mmHg  Pulse 57  Ht 5\' 4"  (1.626 m)  Wt 136 lb (61.689 kg)  BMI 23.33 kg/m2  SpO2  99%  Physical Examination: General: Well developed, well nourished, NAD HEENT: OP clear, mucus membranes moist SKIN: warm, dry. No rashes. Neuro: No focal deficits Musculoskeletal: Muscle strength 5/5 all ext Psychiatric: Mood and affect normal Neck: No JVD, no carotid bruits, no thyromegaly, no lymphadenopathy. Lungs:Clear bilaterally, no wheezes, rhonci, crackles Cardiovascular: Regular rate and rhythm. No murmurs, gallops or rubs. Abdomen:Soft. Bowel sounds present. Non-tender.  Extremities: No lower extremity edema. Pulses are 2 + in the bilateral DP/PT  Cardiac cath 05/14/15: Left Anterior Descending  The vessel is large .   Marland Kitchen Dist LAD lesion, 30% stenosed. discrete .   Marland Kitchen First Diagonal Branch   The vessel is moderate in size.   Colon Flattery 1st Diag to 1st Diag lesion, 100% stenosed. thrombotic .   Marland Kitchen PCI: There is no pre-interventional antegrade distal flow (TIMI 0). Pre-stent angioplasty was performed. A drug-eluting stent was placed. The strut is apposed. Post-stent angioplasty was performed. The post-interventional distal flow is normal (TIMI 3). The intervention was successful. No complications occurred at this lesion.  . There is no residual stenosis post intervention.     . Second Diagonal Branch   The vessel is small in size.      Ramus Intermedius  The vessel is small .     Left Circumflex  The vessel is large is angiographically normal.   . First Obtuse Marginal Branch   The vessel is small in size.   Marland Kitchen Second Obtuse Marginal Branch   The vessel is moderate in size.     Right Coronary Artery  The vessel is moderate in size is angiographically normal.   . Right Posterior Descending Artery   The vessel is moderate in size and is angiographically normal.       EKG:  EKG is not ordered today. The ekg ordered today demonstrates   Recent Labs: 05/14/2015: B Natriuretic Peptide 118.9*; TSH 2.251 05/15/2015: BUN 14; Creatinine, Ser 0.80; Hemoglobin 13.5; Platelets  224; Potassium 4.7; Sodium 140 07/07/2015: ALT 35*   Lipid Panel    Component Value Date/Time   CHOL 100* 07/07/2015 0813   TRIG 79 07/07/2015 0813   HDL 67 07/07/2015 0813   CHOLHDL 1.5 07/07/2015 0813   VLDL 16 07/07/2015 0813   LDLCALC 17 07/07/2015 0813     Wt Readings from Last 3 Encounters:  07/15/15 136 lb (61.689 kg)  05/28/15 136 lb (61.689 kg)  05/15/15 144 lb 13.5 oz (65.7 kg)     Other studies Reviewed: Additional studies/ records that were reviewed today include: . Review of the above records demonstrates:  Assessment and Plan:   1. CAD: recent NSTEMI secondary to occluded Diagonal branch. A drug eluting stent was placed. Mild non-obstructive CAD otherwise. Continue aspirin, Brilinta, statin and beta blocker. She is enrolled in the TWILIGHT study. Will arrange echo to assess LV function post MI.   2. Hyperlipidemia: Continue high potency statin therapy. Lipids and LFTs ok last week.   Current medicines are reviewed at length with the patient today.  The patient does not have concerns regarding medicines.  The following changes have been made:  no change  Labs/ tests ordered today include:   Orders Placed This Encounter  Procedures  . Echocardiogram     Disposition:   FU with me in 6 months   Signed, Lauree Chandler, MD 07/15/2015 9:44 AM    [facility] [place], [place], [place]  [address] Phone: [phone]; Fax: [phone]

## 2015-07-15 NOTE — Patient Instructions (Signed)
Medication Instructions:  Your physician recommends that you continue on your current medications as directed. Please refer to the Current Medication list given to you today.   Labwork: none  Testing/Procedures: Your physician has requested that you have an echocardiogram. Echocardiography is a painless test that uses sound waves to create images of your heart. It provides your doctor with information about the size and shape of your heart and how well your heart's chambers and valves are working. This procedure takes approximately one hour. There are no restrictions for this procedure.    Follow-Up:  Your physician wants you to follow-up in: 6 months.  You will receive a reminder letter in the mail two months in advance. If you don't receive a letter, please call our office to schedule the follow-up appointment.

## 2015-07-28 ENCOUNTER — Ambulatory Visit
Admission: RE | Admit: 2015-07-28 | Discharge: 2015-07-28 | Disposition: A | Discharge status: Home / Self Care | Payer: 59 | Source: Ambulatory Visit

## 2015-07-28 DIAGNOSIS — Z1231 Encounter for screening mammogram for malignant neoplasm of breast: Secondary | ICD-10-CM

## 2015-07-29 ENCOUNTER — Encounter: Payer: Self-pay | Admitting: *Deleted

## 2015-07-29 DIAGNOSIS — Z006 Encounter for examination for normal comparison and control in clinical research program: Secondary | ICD-10-CM

## 2015-07-29 NOTE — Progress Notes (Signed)
TWILIGHT 3 month randomization visit completed. Patient denies any adverse or bleeding events. Bottles #'s [id number]; [phone] dispensed to patient with instructions. Questions encouraged and answered.

## 2015-08-02 ENCOUNTER — Ambulatory Visit (HOSPITAL_COMMUNITY): Payer: 59 | Attending: Cardiology

## 2015-08-02 ENCOUNTER — Other Ambulatory Visit: Payer: Self-pay

## 2015-08-02 DIAGNOSIS — I340 Nonrheumatic mitral (valve) insufficiency: Secondary | ICD-10-CM | POA: Diagnosis not present

## 2015-08-02 DIAGNOSIS — I2510 Atherosclerotic heart disease of native coronary artery without angina pectoris: Secondary | ICD-10-CM | POA: Diagnosis not present

## 2015-09-16 ENCOUNTER — Encounter: Payer: Self-pay | Admitting: *Deleted

## 2015-09-16 DIAGNOSIS — Z006 Encounter for examination for normal comparison and control in clinical research program: Secondary | ICD-10-CM

## 2015-09-16 NOTE — Progress Notes (Signed)
TWILIGHT Research 4 month telephone follow-up completed. Patient denies any adverse or bleeding events. States compliant with brilinta and ASA/placebo, denies taking any open label ASA. Next research visit due in April and we will call to schedule. Questions encouraged and answered.

## 2016-01-28 ENCOUNTER — Encounter: Payer: Self-pay | Admitting: Cardiovascular Disease

## 2016-01-28 ENCOUNTER — Ambulatory Visit (INDEPENDENT_AMBULATORY_CARE_PROVIDER_SITE_OTHER): Payer: 59 | Admitting: Cardiovascular Disease

## 2016-01-28 ENCOUNTER — Other Ambulatory Visit: Payer: Self-pay | Admitting: *Deleted

## 2016-01-28 ENCOUNTER — Encounter: Payer: Self-pay | Admitting: *Deleted

## 2016-01-28 VITALS — BP 108/60 | HR 50 | Ht 64.0 in | Wt 141.1 lb

## 2016-01-28 DIAGNOSIS — Z006 Encounter for examination for normal comparison and control in clinical research program: Secondary | ICD-10-CM

## 2016-01-28 DIAGNOSIS — I2510 Atherosclerotic heart disease of native coronary artery without angina pectoris: Secondary | ICD-10-CM

## 2016-01-28 DIAGNOSIS — E785 Hyperlipidemia, unspecified: Secondary | ICD-10-CM

## 2016-01-28 MED ORDER — AMBULATORY NON FORMULARY MEDICATION: 90.0000 mg/kg/h | Freq: Two times a day (BID) | Status: DC

## 2016-01-28 MED ORDER — ATORVASTATIN CALCIUM 40 MG PO TABS: 40.0000 mg | ORAL_TABLET | Freq: Every day | ORAL | Status: DC

## 2016-01-28 NOTE — Progress Notes (Signed)
Chief Complaint  Patient presents with  . Follow-up  . Coronary Artery Disease    History of Present Illness: 55 year-old female with h/o CAD, HLD, anxiety who is here today for cardiac follow up. She was recently to [facility] 05/14/15 with a NSTEMI.  Cardiac catheterization showed severe first diagonal disease and this was successfully treated with a drug-eluting stent. Echo November 2016 with normal LV function, mild MR.   She is here today for follow up. She feels great. No chest pain, SOB, fatigue. She is not exercising.   Primary Care Physician: Webb Silversmith   Past Medical History  Diagnosis Date  . Anxiety   . Osteoporosis   . Hyperlipidemia LDL goal <70 05/15/2015  . CAD (coronary artery disease)     a. 04/2015 NSTEMI/PCI: LM nl, LAD 30d, D1 100 (2.25x12 Promus Premier DES), D2 small, RI small, LCX nl, OM1/2 nl, RCA/PDA nl, EF 45-50%.    Past Surgical History  Procedure Laterality Date  . Wisdom tooth extraction  1984  . Cardiac catheterization N/A 05/14/2015    Procedure: Left Heart Cath and Coronary Angiography;  Surgeon: Burnell Blanks, MD;  Location: [place] [facility];  Service: Cardiovascular;  Laterality: N/A;  . Cardiac catheterization N/A 05/14/2015    Procedure: Coronary Stent Intervention;  Surgeon: Burnell Blanks, MD;  Location: [place] [facility];  Service: Cardiovascular;  Laterality: N/A;    Current Outpatient Prescriptions  Medication Sig Dispense Refill  . acetaminophen (TYLENOL) 325 MG tablet Take 2 tablets (650 mg total) by mouth every 4 (four) hours as needed for headache or mild pain.    Marland Kitchen alendronate (FOSAMAX) 70 MG tablet Take 70 mg by mouth once a week.     . AMBULATORY NON FORMULARY MEDICATION Take 81 mg by mouth daily. Medication Name: ASA 81 mg daily (provoded by the [employer] study)    . AMBULATORY NON FORMULARY MEDICATION Medication Name:     . atorvastatin (LIPITOR) 40 MG tablet Take 1 tablet (40 mg total) by mouth  daily. 90 tablet 3  . cholecalciferol (VITAMIN D) 1000 UNITS tablet Take 1,000 Units by mouth daily.    . citalopram (CELEXA) 20 MG tablet Take 20 mg by mouth daily.     . metoprolol tartrate (LOPRESSOR) 25 MG tablet Take 0.5 tablets (12.5 mg total) by mouth 2 (two) times daily. 90 tablet 3  . nitroGLYCERIN (NITROSTAT) 0.4 MG SL tablet Place 1 tablet (0.4 mg total) under the tongue every 5 (five) minutes x 3 doses as needed for chest pain. 25 tablet 4  . AMBULATORY NON FORMULARY MEDICATION Take 90 mg elemental calcium/kg/hr by mouth 2 (two) times daily. Medication Name: Brilinta 90 mg BID (TWILIGHT Research study provided do not fill)     No current facility-administered medications for this visit.    Allergies  Allergen Reactions  . Erythromycin Nausea And Vomiting  . Floxin [Ofloxacin] Other (See Comments)    Unable to close eyes  . Macrobid [Nitrofurantoin Macrocrystal] Nausea And Vomiting    Social History   Social History  . Marital Status: Married    Spouse Name: N/A  . Number of Children: N/A  . Years of Education: N/A   Occupational History  . Not on file.   Social History Main Topics  . Smoking status: Never Smoker   . Smokeless tobacco: Never Used  . Alcohol Use: No  . Drug Use: No  . Sexual Activity: Yes    Birth Control/ Protection:  Post-menopausal   Other Topics Concern  . Not on file   Social History Narrative    Family History  Problem Relation Age of Onset  . Colon cancer Cousin 42  . Heart disease Mother   . Heart disease Father   . COPD Father   . Breast cancer Maternal Aunt   . Diabetes Maternal Aunt   . Heart disease Paternal Aunt   . Heart attack Maternal Grandmother   . Heart attack Paternal Grandmother     Review of Systems:  As stated in the HPI and otherwise negative.   BP 108/60 mmHg  Pulse 50  Ht 5\' 4"  (1.626 m)  Wt 141 lb 1.9 oz (64.012 kg)  BMI 24.21 kg/m2  SpO2 99%  Physical Examination: General: Well developed, well  nourished, NAD HEENT: OP clear, mucus membranes moist SKIN: warm, dry. No rashes. Neuro: No focal deficits Musculoskeletal: Muscle strength 5/5 all ext Psychiatric: Mood and affect normal Neck: No JVD, no carotid bruits, no thyromegaly, no lymphadenopathy. Lungs:Clear bilaterally, no wheezes, rhonci, crackles Cardiovascular: Regular rate and rhythm. No murmurs, gallops or rubs. Abdomen:Soft. Bowel sounds present. Non-tender.  Extremities: No lower extremity edema. Pulses are 2 + in the bilateral DP/PT  Cardiac cath 05/14/15: Left Anterior Descending  The vessel is large .   Marland Kitchen Dist LAD lesion, 30% stenosed. discrete .   Marland Kitchen First Diagonal Branch   The vessel is moderate in size.   Colon Flattery 1st Diag to 1st Diag lesion, 100% stenosed. thrombotic .   Marland Kitchen PCI: There is no pre-interventional antegrade distal flow (TIMI 0). Pre-stent angioplasty was performed. A drug-eluting stent was placed. The strut is apposed. Post-stent angioplasty was performed. The post-interventional distal flow is normal (TIMI 3). The intervention was successful. No complications occurred at this lesion.  . There is no residual stenosis post intervention.     . Second Diagonal Branch   The vessel is small in size.      Ramus Intermedius  The vessel is small .     Left Circumflex  The vessel is large is angiographically normal.   . First Obtuse Marginal Branch   The vessel is small in size.   Marland Kitchen Second Obtuse Marginal Branch   The vessel is moderate in size.     Right Coronary Artery  The vessel is moderate in size is angiographically normal.   . Right Posterior Descending Artery   The vessel is moderate in size and is angiographically normal.     Echo 08/02/15: Left ventricle: The cavity size was normal. Wall thickness was  normal. Systolic function was normal. The estimated ejection  fraction was in the range of 55% to 60%. Wall motion was normal;  there were no regional wall motion abnormalities. Left   ventricular diastolic function parameters were normal. - Aortic valve: There was no stenosis. - Mitral valve: There was mild regurgitation. - Right ventricle: The cavity size was normal. Systolic function  was normal. - Pulmonary arteries: No complete TR doppler jet so unable to  estimate PA systolic pressure. - Inferior vena cava: The vessel was normal in size. The  respirophasic diameter changes were in the normal range (>= 50%),  consistent with normal central venous pressure.  Impressions:  - Normal LV size with EF 55-60%. Normal diastolic function. Normal  RV size and systolic function. Mild MR.  EKG:  EKG is not ordered today. The ekg ordered today demonstrates   Recent Labs: 05/14/2015: B Natriuretic Peptide 118.9*; TSH 2.251  05/15/2015: BUN 14; Creatinine, Ser 0.80; Hemoglobin 13.5; Platelets 224; Potassium 4.7; Sodium 140 07/07/2015: ALT 35*   Lipid Panel    Component Value Date/Time   CHOL 100* 07/07/2015 0813   TRIG 79 07/07/2015 0813   HDL 67 07/07/2015 0813   CHOLHDL 1.5 07/07/2015 0813   VLDL 16 07/07/2015 0813   LDLCALC 17 07/07/2015 0813     Wt Readings from Last 3 Encounters:  01/28/16 141 lb 1.9 oz (64.012 kg)  07/15/15 136 lb (61.689 kg)  05/28/15 136 lb (61.689 kg)     Other studies Reviewed: Additional studies/ records that were reviewed today include: . Review of the above records demonstrates:    Assessment and Plan:   1. CAD: She is having no chest pain suggestive of angina. She presented with a NSTEMI August 2017 secondary to occluded Diagonal branch. A drug eluting stent was placed. Mild non-obstructive CAD otherwise. Continue aspirin, Brilinta, statin and beta blocker. She is enrolled in the TWILIGHT study.   2. Hyperlipidemia: She has ben on high potency statin therapy. LDL is 17. Will lower Lipitor to 40 mg daily.   Current medicines are reviewed at length with the patient today.  The patient does not have concerns regarding  medicines.  The following changes have been made:  no change  Labs/ tests ordered today include:   No orders of the defined types were placed in this encounter.     Disposition:   FU with me in 12 months   Signed, Lauree Chandler, MD 01/28/2016 1:49 PM    [facility] [place], [place],   [address] Phone: [phone]; Fax: [phone]

## 2016-01-28 NOTE — Progress Notes (Signed)
TWILIGHT Research 9 month follow up completed. Patient denies any bleeding or adverse events. States compliant with medication may have missed a "few" doses of medication. Her next research visit window is 10/21/16----11/18/16. This will be her last appointment and will be returning Study Brilinta & ASA/Placebo. Further antiplatelet therapy will be at the discretion of her cardiologist. Questions encouraged and answered.

## 2016-01-28 NOTE — Patient Instructions (Signed)
Medication Instructions:  Your physician has recommended you make the following change in your medication: Decrease Atorvastatin to 40 mg by mouth daily   Labwork: none  Testing/Procedures: none  Follow-Up: Your physician wants you to follow-up in: 12 months.  You will receive a reminder letter in the mail two months in advance. If you don't receive a letter, please call our office to schedule the follow-up appointment.   Any Other Special Instructions Will Be Listed Below (If Applicable).     If you need a refill on your cardiac medications before your next appointment, please call your pharmacy.

## 2016-04-21 ENCOUNTER — Telehealth: Payer: Self-pay | Admitting: Cardiovascular Disease

## 2016-04-21 NOTE — Telephone Encounter (Signed)
New message      The pt is asking is it ok to take herbal natural supplements for the memory and concentration if it would interfere with her heart medications.

## 2016-04-21 NOTE — Telephone Encounter (Signed)
I spoke with pt. She has a list of several supplements she is considering. She will decide which one she would like to try and call us back with this information. I told her we could then review with pharmacist in our office.

## 2016-05-18 ENCOUNTER — Other Ambulatory Visit: Payer: Self-pay | Admitting: Nurse Practitioner

## 2016-06-26 ENCOUNTER — Other Ambulatory Visit: Payer: Self-pay | Admitting: Obstetrics and Gynecology

## 2016-06-26 DIAGNOSIS — Z1231 Encounter for screening mammogram for malignant neoplasm of breast: Secondary | ICD-10-CM

## 2016-07-31 ENCOUNTER — Ambulatory Visit
Admission: RE | Admit: 2016-07-31 | Discharge: 2016-07-31 | Disposition: A | Discharge status: Home / Self Care | Payer: 59 | Source: Ambulatory Visit | Attending: Obstetrics and Gynecology | Admitting: Obstetrics and Gynecology

## 2016-07-31 DIAGNOSIS — Z1231 Encounter for screening mammogram for malignant neoplasm of breast: Secondary | ICD-10-CM

## 2016-08-09 ENCOUNTER — Telehealth: Payer: Self-pay | Admitting: *Deleted

## 2016-08-09 MED ORDER — CLOPIDOGREL BISULFATE 75 MG PO TABS: 75.0000 mg | ORAL_TABLET | Freq: Every day | ORAL | 3 refills | Status: DC

## 2016-08-09 MED ORDER — ASPIRIN EC 81 MG PO TBEC: 81.0000 mg | DELAYED_RELEASE_TABLET | Freq: Every day | ORAL | 3 refills | Status: AC

## 2016-08-09 NOTE — Telephone Encounter (Signed)
          Fraser Din, Can we call Ms. Glennon Hamilton and change her therapy to ASA 81 mg daily and Plavix 75 mg daily? She will stop Brilinta and the study drug (ASA or placebo). Thanks, chris   I spoke with pt and gave her instructions from Dr. Angelena Form.  [facility]

## 2016-08-14 ENCOUNTER — Encounter: Payer: Self-pay | Admitting: *Deleted

## 2016-08-14 ENCOUNTER — Other Ambulatory Visit: Payer: Self-pay | Admitting: *Deleted

## 2016-08-14 DIAGNOSIS — Z006 Encounter for examination for normal comparison and control in clinical research program: Secondary | ICD-10-CM

## 2016-08-14 NOTE — Progress Notes (Signed)
TWILIGHT Research study month 15 visit completed. Patient denies any bleeding events or other adverse events. She states she has been compliant with ASA/Placebo and Brilinta. See has been prescribed Plavix 75 mg and ASA 81 mg Daily. Patient forgot TWILIGHT research drug for pill count and we call me with exact number of remaining pills. Patient has 18 month follow up visit due no later than 24/feb/2018 and that will be the end of study.

## 2016-08-15 ENCOUNTER — Telehealth: Payer: Self-pay | Admitting: *Deleted

## 2016-08-15 NOTE — Telephone Encounter (Signed)
Returned call to patient about compliance with ASA/Placebo and Brilinta. She forgot to bring medication at 15 month [place] visit and called the research office with the Number of remaining pills. After calculating compliance patient was 97.9% compliant with ASA/Placebo and 76.8% compliant with Brilinta. I left patient a message informing her of her compliance and thanking her again for participating in study.

## 2016-09-04 ENCOUNTER — Telehealth: Payer: Self-pay | Admitting: Cardiovascular Disease

## 2016-09-04 NOTE — Telephone Encounter (Signed)
New Message  Pt voiced has question if it's okay to take magnesium and cinnamon supplements.  Please f/u

## 2016-09-04 NOTE — Telephone Encounter (Signed)
Reviewed with Fuller Canada, PharmD and OK to take. I spoke with pt and told her she could take these supplements.

## 2016-11-06 ENCOUNTER — Encounter: Payer: Self-pay | Admitting: *Deleted

## 2016-11-06 DIAGNOSIS — Z006 Encounter for examination for normal comparison and control in clinical research program: Secondary | ICD-10-CM

## 2016-11-06 NOTE — Progress Notes (Signed)
TWILIGHT Research study month 18 telephone follow up completed. Patient denies any bleeding or other adverse events. She remains on ASA 81mg  and Plavix 75 mg daily. I thanked her for participating in the research study and informed her this would be the last phone call.

## 2016-12-04 ENCOUNTER — Ambulatory Visit: Payer: 59 | Admitting: Internal Medicine

## 2016-12-07 ENCOUNTER — Encounter: Payer: Self-pay | Admitting: Internal Medicine

## 2016-12-07 ENCOUNTER — Ambulatory Visit (INDEPENDENT_AMBULATORY_CARE_PROVIDER_SITE_OTHER): Payer: 59 | Admitting: Internal Medicine

## 2016-12-07 VITALS — BP 106/68 | HR 64 | Temp 97.8°F | Wt 152.2 lb

## 2016-12-07 DIAGNOSIS — R41840 Attention and concentration deficit: Secondary | ICD-10-CM

## 2016-12-07 DIAGNOSIS — R6889 Other general symptoms and signs: Secondary | ICD-10-CM | POA: Diagnosis not present

## 2016-12-07 NOTE — Progress Notes (Signed)
Subjective:    Patient ID: Brenda Chavez, female    DOB: [birth year], 56 y.o.   MRN: [id number]  HPI  Pt presents to the clinic today with c/o difficulty concentrating. She noticed these symptoms about 1 year. She also reports having trouble multi tasking, and being extremely forgetful. 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 She feels like she is sleeping well, feels rested when she wakes up. She does have anxiety and is on Celexa. She feels like her symptoms are well controlled on this. She denies having any issues like this as a child.  Review of Systems  Past Medical History:  Diagnosis Date  . Anxiety   . CAD (coronary artery disease)    a. 04/2015 NSTEMI/PCI: LM nl, LAD 30d, D1 100 (2.25x12 Promus Premier DES), D2 small, RI small, LCX nl, OM1/2 nl, RCA/PDA nl, EF 45-50%.  . Hyperlipidemia LDL goal <70 05/15/2015  . Osteoporosis       Allergies  Allergen Reactions  . Erythromycin Nausea And Vomiting  . Floxin [Ofloxacin] Other (See Comments)    Unable to close eyes  . Macrobid [Nitrofurantoin Macrocrystal] Nausea And Vomiting    Family History  Problem Relation Age of Onset  . Colon cancer Cousin 66  . Heart disease Mother   . Heart disease Father   . COPD Father   . Breast cancer Maternal Aunt   . Diabetes Maternal Aunt   . Heart disease Paternal Aunt   . Heart attack Maternal Grandmother   . Heart attack Paternal Grandmother     Social History   Social History  . Marital status: Married    Spouse name: N/A  . Number of children: N/A  . Years of education: N/A   Occupational History  . Not on file.   Social History Main Topics  . Smoking status: Never Smoker  . Smokeless tobacco: Never Used  . Alcohol use No  . Drug use: No  . Sexual activity: Yes    Birth control/ protection: Post-menopausal   Other Topics Concern  . Not on file   Social History Narrative  . No narrative on file     Constitutional: Denies fever,  malaise, fatigue, headache or abrupt weight changes.  Neurological: Pt reports difficulty with memory. Denies dizziness, difficulty with speech or problems with balance and coordination.  Psych: Pt has history of anxiety. Denies depression, SI/HI.  No other specific complaints in a complete review of systems (except as listed in HPI above).     Objective:   Physical Exam  BP 106/68   Pulse 64   Temp 97.8 F (36.6 C) (Oral)   Wt 152 lb 4 oz (69.1 kg)   SpO2 98%   BMI 26.13 kg/m  Wt Readings from Last 3 Encounters:  12/07/16 152 lb 4 oz (69.1 kg)  01/28/16 141 lb 1.9 oz (64 kg)  07/15/15 136 lb (61.7 kg)    General: Appears her stated age, well developed, well nourished in NAD. Neurological: Alert and oriented.  Psychiatric: Mood and affect normal. Behavior is normal. Judgment and thought content normal.     BMET    Component Value Date/Time   NA 140 05/15/2015 0259   K 4.7 05/15/2015 0259   CL 109 05/15/2015 0259   CO2 24 05/15/2015 0259   GLUCOSE 119 (H) 05/15/2015 0259   BUN 14 05/15/2015 0259   CREATININE 0.80 05/15/2015 0259   CALCIUM 8.9 05/15/2015 0259   GFRNONAA >60 05/15/2015 0259  GFRAA >60 05/15/2015 0259    Lipid Panel     Component Value Date/Time   CHOL 100 (L) 07/07/2015 0813   TRIG 79 07/07/2015 0813   HDL 67 07/07/2015 0813   CHOLHDL 1.5 07/07/2015 0813   VLDL 16 07/07/2015 0813   LDLCALC 17 07/07/2015 0813    CBC    Component Value Date/Time   WBC 6.5 05/15/2015 0259   RBC 4.38 05/15/2015 0259   HGB 13.5 05/15/2015 0259   HCT 41.8 05/15/2015 0259   PLT 224 05/15/2015 0259   MCV 95.4 05/15/2015 0259   MCH 30.8 05/15/2015 0259   MCHC 32.3 05/15/2015 0259   RDW 14.0 05/15/2015 0259   LYMPHSABS 2.4 05/14/2015 0221   MONOABS 0.7 05/14/2015 0221   EOSABS 0.1 05/14/2015 0221   BASOSABS 0.0 05/14/2015 0221    Hgb A1C Lab Results  Component Value Date   HGBA1C 6.0 (H) 05/14/2015           Assessment & Plan:   Difficulty  concentrating, difficulty multitasking and forgetfullness:  Referral placed to Dr. Lurline Hare for ADD testing  RTC as needed or if symptoms persist or worsen Webb Silversmith, NP

## 2016-12-21 ENCOUNTER — Other Ambulatory Visit: Payer: Self-pay | Admitting: *Deleted

## 2016-12-21 MED ORDER — METOPROLOL TARTRATE 25 MG PO TABS: ORAL_TABLET | ORAL | 0 refills | Status: DC

## 2016-12-27 ENCOUNTER — Telehealth: Payer: Self-pay | Admitting: Cardiovascular Disease

## 2016-12-27 NOTE — Telephone Encounter (Signed)
I spoke with pt and told her our records show she is taking metoprolol tartrate 12.5 mg by mouth twice daily. She reports this is dose she has been taking.

## 2016-12-27 NOTE — Telephone Encounter (Signed)
New Message:  Pt wants to know what milligram of Metoprolol is she supposed to be taking?

## 2016-12-27 NOTE — Telephone Encounter (Signed)
Left message to call back  

## 2017-01-05 ENCOUNTER — Ambulatory Visit (INDEPENDENT_AMBULATORY_CARE_PROVIDER_SITE_OTHER): Payer: 59 | Admitting: Psychology

## 2017-01-05 DIAGNOSIS — F4322 Adjustment disorder with anxiety: Secondary | ICD-10-CM | POA: Diagnosis not present

## 2017-01-12 ENCOUNTER — Encounter: Payer: Self-pay | Admitting: Cardiovascular Disease

## 2017-01-18 ENCOUNTER — Other Ambulatory Visit: Payer: Self-pay

## 2017-01-18 ENCOUNTER — Ambulatory Visit (INDEPENDENT_AMBULATORY_CARE_PROVIDER_SITE_OTHER): Payer: 59 | Admitting: Psychology

## 2017-01-18 DIAGNOSIS — F4322 Adjustment disorder with anxiety: Secondary | ICD-10-CM | POA: Diagnosis not present

## 2017-01-18 MED ORDER — METOPROLOL TARTRATE 25 MG PO TABS: ORAL_TABLET | ORAL | 0 refills | Status: DC

## 2017-01-19 ENCOUNTER — Telehealth: Payer: Self-pay

## 2017-01-19 NOTE — Telephone Encounter (Signed)
I do not use ADD medications outside of a diagnosis of ADD. Dr. Glennon Hamilton felt like her issues were more stress related. How does she feel like her stress levels are? And does she think the Celexa is controlling her symptoms?

## 2017-01-19 NOTE — Telephone Encounter (Signed)
Pt last seen 12/07/16; pt was sent to Dr Irven Shelling to be tested for ADHD; pt said she does not have ADHD but pt wants to know if there is any med that could be prescribed for concentration, focusing and memory.  Pt request [facility].

## 2017-01-22 MED ORDER — CITALOPRAM HYDROBROMIDE 40 MG PO TABS: 40.0000 mg | ORAL_TABLET | Freq: Every day | ORAL | 2 refills | Status: DC

## 2017-01-22 NOTE — Telephone Encounter (Signed)
Pt states the Celexa is helping some but she still is having issues with forgetting things and focusing at work... Pt is okay with either increasing Celexa or changing med--- [facility] just in case it does not work or has a reaction... If nothing further needed to convey to pt she knows to update in about 4 weeks or sooner if Sx worsen... Please advise

## 2017-01-22 NOTE — Addendum Note (Signed)
Addended by: Jearld Fenton on: 01/22/2017 01:51 PM   Modules accepted: Orders

## 2017-01-22 NOTE — Telephone Encounter (Signed)
Will increase Celexa to 40 mg daily, eRx sent to pharmacy

## 2017-01-26 ENCOUNTER — Ambulatory Visit: Payer: 59 | Admitting: Psychology

## 2017-02-05 ENCOUNTER — Ambulatory Visit (INDEPENDENT_AMBULATORY_CARE_PROVIDER_SITE_OTHER): Payer: 59 | Admitting: Cardiovascular Disease

## 2017-02-05 ENCOUNTER — Encounter: Payer: Self-pay | Admitting: Cardiovascular Disease

## 2017-02-05 VITALS — BP 118/78 | HR 62 | Ht 64.0 in | Wt 153.0 lb

## 2017-02-05 DIAGNOSIS — E7800 Pure hypercholesterolemia, unspecified: Secondary | ICD-10-CM

## 2017-02-05 DIAGNOSIS — I2510 Atherosclerotic heart disease of native coronary artery without angina pectoris: Secondary | ICD-10-CM | POA: Diagnosis not present

## 2017-02-05 NOTE — Patient Instructions (Signed)
Medication Instructions:  Your physician has recommended you make the following change in your medication:  Stop Clopidogrel.    Labwork: none  Testing/Procedures: none  Follow-Up: Your physician recommends that you schedule a follow-up appointment in: 12 months. Please call our office in about 9 months to schedule this appointment.     Any Other Special Instructions Will Be Listed Below (If Applicable).     If you need a refill on your cardiac medications before your next appointment, please call your pharmacy.

## 2017-02-05 NOTE — Progress Notes (Signed)
Chief Complaint  Patient presents with  . Follow-up    CAD   History of Present Illness: 56 yo female with history of CAD, HLD here today for cardiac follow up. She was admitted to [facility] August 2016 with a NSTEMI and found to have a severe stenosis in the Diagonal branch treated with a drug eluting stent. Echo 2016 with normal LV function, mild MR.   She is here today for follow up. The patient denies any chest pain, dyspnea, palpitations, lower extremity edema, orthopnea, PND, dizziness, near syncope or syncope. She is very active. She does not exercise regularly.   Primary Care Physician: Jearld Fenton, NP  Past Medical History:  Diagnosis Date  . Anxiety   . CAD (coronary artery disease)    a. 04/2015 NSTEMI/PCI: LM nl, LAD 30d, D1 100 (2.25x12 Promus Premier DES), D2 small, RI small, LCX nl, OM1/2 nl, RCA/PDA nl, EF 45-50%.  . Hyperlipidemia LDL goal <70 05/15/2015  . Osteoporosis     Past Surgical History:  Procedure Laterality Date  . CARDIAC CATHETERIZATION N/A 05/14/2015   Procedure: Left Heart Cath and Coronary Angiography;  Surgeon: Burnell Blanks, MD;  Location: [facility];  Service: Cardiovascular;  Laterality: N/A;  . CARDIAC CATHETERIZATION N/A 05/14/2015   Procedure: Coronary Stent Intervention;  Surgeon: Burnell Blanks, MD;  Location: [place] [facility];  Service: Cardiovascular;  Laterality: N/A;  . WISDOM TOOTH EXTRACTION  1984   Current Medications:  Current Outpatient Prescriptions  Medication Sig Dispense Refill  . acetaminophen (TYLENOL) 325 MG tablet Take 2 tablets (650 mg total) by mouth every 4 (four) hours as needed for headache or mild pain.    Marland Kitchen alendronate (FOSAMAX) 70 MG tablet Take 70 mg by mouth once a week.     . AMBULATORY NON FORMULARY MEDICATION Medication Name:     . aspirin EC 81 MG tablet Take 1 tablet (81 mg total) by mouth daily. 90 tablet 3  . atorvastatin (LIPITOR) 40 MG tablet Take 1 tablet (40 mg total) by mouth  daily. 90 tablet 3  . cholecalciferol (VITAMIN D) 1000 UNITS tablet Take 1,000 Units by mouth daily.    Marland Kitchen CINNAMON PO Take 1 capsule by mouth 2 (two) times daily.    . citalopram (CELEXA) 40 MG tablet Take 1 tablet (40 mg total) by mouth daily. 30 tablet 2  . MAGNESIUM CITRATE PO Take 1 capsule by mouth daily.    . metoprolol tartrate (LOPRESSOR) 25 MG tablet TAKE ONE-HALF (1/2) TABLET TWICE A DAY 60 tablet 0  . nitroGLYCERIN (NITROSTAT) 0.4 MG SL tablet Place 1 tablet (0.4 mg total) under the tongue every 5 (five) minutes x 3 doses as needed for chest pain. 25 tablet 4   No current facility-administered medications for this visit.      Allergies  Allergen Reactions  . Erythromycin Nausea And Vomiting  . Floxin [Ofloxacin] Other (See Comments)    Unable to close eyes  . Macrobid [Nitrofurantoin Macrocrystal] Nausea And Vomiting    Social History   Social History  . Marital status: Married    Spouse name: N/A  . Number of children: N/A  . Years of education: N/A   Occupational History  . Not on file.   Social History Main Topics  . Smoking status: Never Smoker  . Smokeless tobacco: Never Used  . Alcohol use No  . Drug use: No  . Sexual activity: Yes    Birth control/ protection: Post-menopausal  Other Topics Concern  . Not on file   Social History Narrative  . No narrative on file    Family History  Problem Relation Age of Onset  . Colon cancer Cousin 71  . Heart disease Mother   . Heart disease Father   . COPD Father   . Heart attack Maternal Grandmother   . Heart attack Paternal Grandmother   . Breast cancer Maternal Aunt   . Diabetes Maternal Aunt   . Heart disease Paternal Aunt     Review of Systems:  As stated in the HPI and otherwise negative.   BP 118/78   Pulse 62   Ht 5\' 4"  (1.626 m)   Wt 153 lb (69.4 kg)   SpO2 96%   BMI 26.26 kg/m   Physical Examination:  General: Well developed, well nourished, NAD  HEENT: OP clear, mucus membranes  moist  SKIN: warm, dry. No rashes. Neuro: No focal deficits  Musculoskeletal: Muscle strength 5/5 all ext  Psychiatric: Mood and affect normal  Neck: No JVD, no carotid bruits, no thyromegaly, no lymphadenopathy.  Lungs:Clear bilaterally, no wheezes, rhonci, crackles Cardiovascular: Regular rate and rhythm. No murmurs, gallops or rubs. Abdomen:Soft. Bowel sounds present. Non-tender.  Extremities: No lower extremity edema. Pulses are 2 + in the bilateral DP/PT.  Cardiac cath 05/14/15: Left Anterior Descending  The vessel is large .   Marland Kitchen Dist LAD lesion, 30% stenosed. discrete .   Marland Kitchen First Diagonal Branch   The vessel is moderate in size.   Colon Flattery 1st Diag to 1st Diag lesion, 100% stenosed. thrombotic .   Marland Kitchen PCI: There is no pre-interventional antegrade distal flow (TIMI 0). Pre-stent angioplasty was performed. A drug-eluting stent was placed. The strut is apposed. Post-stent angioplasty was performed. The post-interventional distal flow is normal (TIMI 3). The intervention was successful. No complications occurred at this lesion.  . There is no residual stenosis post intervention.     . Second Diagonal Branch   The vessel is small in size.      Ramus Intermedius  The vessel is small .     Left Circumflex  The vessel is large is angiographically normal.   . First Obtuse Marginal Branch   The vessel is small in size.   Marland Kitchen Second Obtuse Marginal Branch   The vessel is moderate in size.     Right Coronary Artery  The vessel is moderate in size is angiographically normal.   . Right Posterior Descending Artery   The vessel is moderate in size and is angiographically normal.     Echo 08/02/15: Left ventricle: The cavity size was normal. Wall thickness was  normal. Systolic function was normal. The estimated ejection  fraction was in the range of 55% to 60%. Wall motion was normal;  there were no regional wall motion abnormalities. Left  ventricular diastolic function parameters  were normal. - Aortic valve: There was no stenosis. - Mitral valve: There was mild regurgitation. - Right ventricle: The cavity size was normal. Systolic function  was normal. - Pulmonary arteries: No complete TR doppler jet so unable to  estimate PA systolic pressure. - Inferior vena cava: The vessel was normal in size. The  respirophasic diameter changes were in the normal range (>= 50%),  consistent with normal central venous pressure.  Impressions:  - Normal LV size with EF 55-60%. Normal diastolic function. Normal  RV size and systolic function. Mild MR.  EKG:  EKG is not ordered today. The ekg ordered  today demonstrates NSR, rate 62 bpm.   Recent Labs: No results found for requested labs within last 8760 hours.   Lipid Panel    Component Value Date/Time   CHOL 100 (L) 07/07/2015 0813   TRIG 79 07/07/2015 0813   HDL 67 07/07/2015 0813   CHOLHDL 1.5 07/07/2015 0813   VLDL 16 07/07/2015 0813   LDLCALC 17 07/07/2015 0813     Wt Readings from Last 3 Encounters:  02/05/17 153 lb (69.4 kg)  12/07/16 152 lb 4 oz (69.1 kg)  01/28/16 141 lb 1.9 oz (64 kg)     Other studies Reviewed: Additional studies/ records that were reviewed today include: . Review of the above records demonstrates:    Assessment and Plan:   1. CAD without angina: No chest pain suggestive of angina. Her NSTEMI was in August of 2016. DES placed in the Diagonal branch of the LAD. Will continue ASA, statin and beta blocker.  Will stop Plavix.   2. Hyperlipidemia: LDL at goal last check here. Will get records of lipids from Sept 2017 from Dr. Julien Girt with [facility]. Continue statin.   Current medicines are reviewed at length with the patient today.  The patient does not have concerns regarding medicines.  The following changes have been made:  no change  Labs/ tests ordered today include:   Orders Placed This Encounter  Procedures  . EKG 12-Lead   Disposition:   FU with me in 12  months   Signed, Lauree Chandler, MD 02/05/2017 1:50 PM    [facility] [address], [place], [place]  [address] Phone: [phone]; Fax: [phone]

## 2017-02-06 ENCOUNTER — Telehealth: Payer: Self-pay | Admitting: Cardiovascular Disease

## 2017-02-06 NOTE — Telephone Encounter (Signed)
Release faxed to Dr.Gretchen Julien Girt.

## 2017-02-15 ENCOUNTER — Telehealth: Payer: Self-pay | Admitting: Cardiovascular Disease

## 2017-02-15 NOTE — Telephone Encounter (Signed)
Records received From [place] office. Placed in [employer].

## 2017-03-12 ENCOUNTER — Other Ambulatory Visit: Payer: Self-pay

## 2017-03-12 MED ORDER — METOPROLOL TARTRATE 25 MG PO TABS: 12.5000 mg | ORAL_TABLET | Freq: Two times a day (BID) | ORAL | 3 refills | Status: DC

## 2017-04-03 ENCOUNTER — Other Ambulatory Visit: Payer: Self-pay | Admitting: Obstetrics and Gynecology

## 2017-04-03 DIAGNOSIS — Z1231 Encounter for screening mammogram for malignant neoplasm of breast: Secondary | ICD-10-CM

## 2017-04-26 ENCOUNTER — Other Ambulatory Visit: Payer: Self-pay | Admitting: Cardiovascular Disease

## 2017-05-08 ENCOUNTER — Other Ambulatory Visit: Payer: Self-pay | Admitting: Internal Medicine

## 2017-07-02 ENCOUNTER — Other Ambulatory Visit: Payer: Self-pay | Admitting: Internal Medicine

## 2017-07-02 NOTE — Telephone Encounter (Signed)
Received refill electronically Last refill 05/09/17 #30/1 - note sent to patient to scheduled annual physical  No upcoming appointment scheduled Last office visit 12/07/16

## 2017-07-03 DIAGNOSIS — Z01419 Encounter for gynecological examination (general) (routine) without abnormal findings: Secondary | ICD-10-CM | POA: Diagnosis not present

## 2017-07-03 NOTE — Telephone Encounter (Signed)
Will give 30 day supply. She must schedule physical or no refills will be given

## 2017-08-03 ENCOUNTER — Other Ambulatory Visit: Payer: Self-pay

## 2017-08-03 MED ORDER — CITALOPRAM HYDROBROMIDE 40 MG PO TABS: ORAL_TABLET | ORAL | 0 refills | Status: DC

## 2017-08-06 ENCOUNTER — Ambulatory Visit
Admission: RE | Admit: 2017-08-06 | Discharge: 2017-08-06 | Disposition: A | Discharge status: Home / Self Care | Payer: 59 | Source: Ambulatory Visit | Attending: Obstetrics and Gynecology | Admitting: Obstetrics and Gynecology

## 2017-08-06 DIAGNOSIS — Z1231 Encounter for screening mammogram for malignant neoplasm of breast: Secondary | ICD-10-CM | POA: Diagnosis not present

## 2017-09-02 ENCOUNTER — Other Ambulatory Visit: Payer: Self-pay | Admitting: Internal Medicine

## 2017-10-19 DIAGNOSIS — D224 Melanocytic nevi of scalp and neck: Secondary | ICD-10-CM | POA: Diagnosis not present

## 2017-10-19 DIAGNOSIS — L821 Other seborrheic keratosis: Secondary | ICD-10-CM | POA: Diagnosis not present

## 2017-10-19 DIAGNOSIS — D2262 Melanocytic nevi of left upper limb, including shoulder: Secondary | ICD-10-CM | POA: Diagnosis not present

## 2018-01-18 ENCOUNTER — Other Ambulatory Visit: Payer: Self-pay | Admitting: Obstetrics and Gynecology

## 2018-01-18 DIAGNOSIS — Z1231 Encounter for screening mammogram for malignant neoplasm of breast: Secondary | ICD-10-CM

## 2018-02-11 DIAGNOSIS — M545 Low back pain: Secondary | ICD-10-CM | POA: Diagnosis not present

## 2018-02-11 DIAGNOSIS — N390 Urinary tract infection, site not specified: Secondary | ICD-10-CM | POA: Diagnosis not present

## 2018-02-19 ENCOUNTER — Encounter: Payer: Self-pay | Admitting: Cardiovascular Disease

## 2018-02-20 ENCOUNTER — Ambulatory Visit (INDEPENDENT_AMBULATORY_CARE_PROVIDER_SITE_OTHER): Payer: 59 | Admitting: Cardiovascular Disease

## 2018-02-20 ENCOUNTER — Encounter: Payer: Self-pay | Admitting: Cardiovascular Disease

## 2018-02-20 VITALS — BP 124/70 | HR 66 | Ht 64.0 in | Wt 163.0 lb

## 2018-02-20 DIAGNOSIS — I2510 Atherosclerotic heart disease of native coronary artery without angina pectoris: Secondary | ICD-10-CM | POA: Diagnosis not present

## 2018-02-20 DIAGNOSIS — E7800 Pure hypercholesterolemia, unspecified: Secondary | ICD-10-CM | POA: Diagnosis not present

## 2018-02-20 MED ORDER — NITROGLYCERIN 0.4 MG SL SUBL: 0.4000 mg | SUBLINGUAL_TABLET | SUBLINGUAL | 6 refills | Status: DC | PRN

## 2018-02-20 MED ORDER — ATORVASTATIN CALCIUM 20 MG PO TABS: 20.0000 mg | ORAL_TABLET | Freq: Every day | ORAL | 3 refills | Status: DC

## 2018-02-20 MED ORDER — METOPROLOL TARTRATE 25 MG PO TABS: 12.5000 mg | ORAL_TABLET | Freq: Two times a day (BID) | ORAL | 3 refills | Status: DC

## 2018-02-20 NOTE — Progress Notes (Signed)
Chief Complaint  Patient presents with  . Follow-up    CAD   History of Present Illness: 57 yo female with history of CAD, HLD here today for cardiac follow up. She was admitted to [facility] August 2016 with a NSTEMI and found to have a severe stenosis in the Diagonal branch treated with a drug eluting stent. Echo 2016 with normal LV function, mild MR.   She is here today for follow up. The patient denies any chest pain, dyspnea, palpitations, lower extremity edema, orthopnea, PND, dizziness, near syncope or syncope.   Primary Care Physician: Jearld Fenton, NP  Past Medical History:  Diagnosis Date  . Anxiety   . CAD (coronary artery disease)    a. 04/2015 NSTEMI/PCI: LM nl, LAD 30d, D1 100 (2.25x12 Promus Premier DES), D2 small, RI small, LCX nl, OM1/2 nl, RCA/PDA nl, EF 45-50%.  . Hyperlipidemia LDL goal <70 05/15/2015  . Osteoporosis     Past Surgical History:  Procedure Laterality Date  . CARDIAC CATHETERIZATION N/A 05/14/2015   Procedure: Left Heart Cath and Coronary Angiography;  Surgeon: Burnell Blanks, MD;  Location: [facility];  Service: Cardiovascular;  Laterality: N/A;  . CARDIAC CATHETERIZATION N/A 05/14/2015   Procedure: Coronary Stent Intervention;  Surgeon: Burnell Blanks, MD;  Location: [facility];  Service: Cardiovascular;  Laterality: N/A;  . WISDOM TOOTH EXTRACTION  1984   Current Medications:   Current Outpatient Medications:  .  acetaminophen (TYLENOL) 325 MG tablet, Take 2 tablets (650 mg total) by mouth every 4 (four) hours as needed for headache or mild pain., Disp: , Rfl:  .  alendronate (FOSAMAX) 70 MG tablet, Take 70 mg by mouth once a week. , Disp: , Rfl:  .  AMBULATORY NON FORMULARY MEDICATION, Medication Name: , Disp: , Rfl:  .  aspirin EC 81 MG tablet, Take 1 tablet (81 mg total) by mouth daily., Disp: 90 tablet, Rfl: 3 .  cholecalciferol (VITAMIN D) 1000 UNITS tablet, Take 1,000 Units by mouth daily., Disp: , Rfl:  .   CINNAMON PO, Take 1 capsule by mouth 2 (two) times daily., Disp: , Rfl:  .  citalopram (CELEXA) 40 MG tablet, TAKE 1 TABLET BY MOUTH DAILY. MUST SCHEDULE ANNUAL EXAM FOR MORE REFILLS, Disp: 30 tablet, Rfl: 1 .  MAGNESIUM CITRATE PO, Take 1 capsule by mouth daily., Disp: , Rfl:  .  metoprolol tartrate (LOPRESSOR) 25 MG tablet, Take 0.5 tablets (12.5 mg total) by mouth 2 (two) times daily., Disp: 90 tablet, Rfl: 3 .  nitroGLYCERIN (NITROSTAT) 0.4 MG SL tablet, Place 1 tablet (0.4 mg total) under the tongue every 5 (five) minutes x 3 doses as needed for chest pain., Disp: 25 tablet, Rfl: 6 .  atorvastatin (LIPITOR) 20 MG tablet, Take 1 tablet (20 mg total) by mouth daily., Disp: 90 tablet, Rfl: 3   Allergies  Allergen Reactions  . Erythromycin Nausea And Vomiting  . Floxin [Ofloxacin] Other (See Comments)    Unable to close eyes  . Macrobid [Nitrofurantoin Macrocrystal] Nausea And Vomiting    Social History   Socioeconomic History  . Marital status: Married    Spouse name: Not on file  . Number of children: Not on file  . Years of education: Not on file  . Highest education level: Not on file  Occupational History  . Not on file  Social Needs  . Financial resource strain: Not on file  . Food insecurity:    Worry: Not on  file    Inability: Not on file  . Transportation needs:    Medical: Not on file    Non-medical: Not on file  Tobacco Use  . Smoking status: Never Smoker  . Smokeless tobacco: Never Used  Substance and Sexual Activity  . Alcohol use: No  . Drug use: No  . Sexual activity: Yes    Birth control/protection: Post-menopausal  Lifestyle  . Physical activity:    Days per week: Not on file    Minutes per session: Not on file  . Stress: Not on file  Relationships  . Social connections:    Talks on phone: Not on file    Gets together: Not on file    Attends religious service: Not on file    Active member of club or organization: Not on file    Attends meetings  of clubs or organizations: Not on file    Relationship status: Not on file  . Intimate partner violence:    Fear of current or ex partner: Not on file    Emotionally abused: Not on file    Physically abused: Not on file    Forced sexual activity: Not on file  Other Topics Concern  . Not on file  Social History Narrative  . Not on file    Family History  Problem Relation Age of Onset  . Colon cancer Cousin 4  . Breast cancer Cousin   . Heart disease Mother   . Heart disease Father   . COPD Father   . Heart attack Maternal Grandmother   . Heart attack Paternal Grandmother   . Breast cancer Maternal Aunt   . Diabetes Maternal Aunt   . Heart disease Paternal Aunt     Review of Systems:  As stated in the HPI and otherwise negative.   BP 124/70   Pulse 66   Ht 5\' 4"  (1.626 m)   Wt 163 lb (73.9 kg)   SpO2 97%   BMI 27.98 kg/m   Physical Examination:  General: Well developed, well nourished, NAD  HEENT: OP clear, mucus membranes moist  SKIN: warm, dry. No rashes. Neuro: No focal deficits  Musculoskeletal: Muscle strength 5/5 all ext  Psychiatric: Mood and affect normal  Neck: No JVD, no carotid bruits, no thyromegaly, no lymphadenopathy.  Lungs:Clear bilaterally, no wheezes, rhonci, crackles Cardiovascular: Regular rate and rhythm. No murmurs, gallops or rubs. Abdomen:Soft. Bowel sounds present. Non-tender.  Extremities: No lower extremity edema. Pulses are 2 + in the bilateral DP/PT.  Cardiac cath 05/14/15: Left Anterior Descending  The vessel is large .   Marland Kitchen Dist LAD lesion, 30% stenosed. discrete .   Marland Kitchen First Diagonal Branch   The vessel is moderate in size.   Colon Flattery 1st Diag to 1st Diag lesion, 100% stenosed. thrombotic .   Marland Kitchen PCI: There is no pre-interventional antegrade distal flow (TIMI 0). Pre-stent angioplasty was performed. A drug-eluting stent was placed. The strut is apposed. Post-stent angioplasty was performed. The post-interventional distal flow is normal  (TIMI 3). The intervention was successful. No complications occurred at this lesion.  . There is no residual stenosis post intervention.     . Second Diagonal Branch   The vessel is small in size.      Ramus Intermedius  The vessel is small .     Left Circumflex  The vessel is large is angiographically normal.   . First Obtuse Marginal Branch   The vessel is small in size.   Marland Kitchen  Second Obtuse Marginal Branch   The vessel is moderate in size.     Right Coronary Artery  The vessel is moderate in size is angiographically normal.   . Right Posterior Descending Artery   The vessel is moderate in size and is angiographically normal.     Echo 08/02/15: Left ventricle: The cavity size was normal. Wall thickness was  normal. Systolic function was normal. The estimated ejection  fraction was in the range of 55% to 60%. Wall motion was normal;  there were no regional wall motion abnormalities. Left  ventricular diastolic function parameters were normal. - Aortic valve: There was no stenosis. - Mitral valve: There was mild regurgitation. - Right ventricle: The cavity size was normal. Systolic function  was normal. - Pulmonary arteries: No complete TR doppler jet so unable to  estimate PA systolic pressure. - Inferior vena cava: The vessel was normal in size. The  respirophasic diameter changes were in the normal range (>= 50%),  consistent with normal central venous pressure.  Impressions:  - Normal LV size with EF 55-60%. Normal diastolic function. Normal  RV size and systolic function. Mild MR.  EKG:  EKG is ordered today. The ekg ordered today demonstrates NSR, rate 66 bpm.   Recent Labs: No results found for requested labs within last 8760 hours.   Lipid Panel    Component Value Date/Time   CHOL 100 (L) 07/07/2015 0813   TRIG 79 07/07/2015 0813   HDL 67 07/07/2015 0813   CHOLHDL 1.5 07/07/2015 0813   VLDL 16 07/07/2015 0813   LDLCALC 17 07/07/2015 0813      Wt Readings from Last 3 Encounters:  02/20/18 163 lb (73.9 kg)  02/05/17 153 lb (69.4 kg)  12/07/16 152 lb 4 oz (69.1 kg)     Other studies Reviewed: Additional studies/ records that were reviewed today include: . Review of the above records demonstrates:    Assessment and Plan:   1. CAD without angina: She had a NSTEMI in August 2016 and had a DES placed in the Diagonal branch. She has done well since then. No chest pain. Will continue ASA, statin and beta blocker.    2. Hyperlipidemia: Lipids checked in Gyn clinic.Scanned Oct 2017. LDL 30 and HDL 82. Continue statin. Lower Lipitor to 20 mg daily   Current medicines are reviewed at length with the patient today.  The patient does not have concerns regarding medicines.  The following changes have been made:  no change  Labs/ tests ordered today include:   No orders of the defined types were placed in this encounter.  Disposition:   FU with me in 12 months  Signed, Lauree Chandler, MD 02/20/2018 3:24 PM    [facility] [place], [place],   [address] Phone: [phone]; Fax: [phone]

## 2018-02-20 NOTE — Patient Instructions (Signed)
Medication Instructions:  Your physician has recommended you make the following change in your medication:  Decrease atorvastatin to 20 mg by mouth daily.    Labwork: none  Testing/Procedures: none  Follow-Up: Your physician recommends that you schedule a follow-up appointment in: 12 months. Please call our office in 8 months to schedule this appointment    Any Other Special Instructions Will Be Listed Below (If Applicable).     If you need a refill on your cardiac medications before your next appointment, please call your pharmacy.

## 2018-02-21 NOTE — Addendum Note (Signed)
Addended by: Mendel Ryder on: 02/21/2018 02:39 PM   Modules accepted: Orders

## 2018-03-18 ENCOUNTER — Ambulatory Visit: Payer: 59 | Admitting: Cardiovascular Disease

## 2018-03-27 ENCOUNTER — Other Ambulatory Visit: Payer: Self-pay | Admitting: Cardiovascular Disease

## 2018-03-27 NOTE — Telephone Encounter (Signed)
Outpatient Medication Detail    Disp Refills Start End   atorvastatin (LIPITOR) 20 MG tablet 90 tablet 3 02/20/2018    Sig - Route: Take 1 tablet (20 mg total) by mouth daily. - Oral   Sent to pharmacy as: atorvastatin (LIPITOR) 20 MG tablet   Notes to Pharmacy: Dose decrease   E-Prescribing Status: Receipt confirmed by pharmacy (02/20/2018 3:18 PM EDT)   Pharmacy   [id number] - [place], [place] - [address]

## 2018-06-17 DIAGNOSIS — Z23 Encounter for immunization: Secondary | ICD-10-CM | POA: Diagnosis not present

## 2018-07-15 DIAGNOSIS — Z13820 Encounter for screening for osteoporosis: Secondary | ICD-10-CM | POA: Diagnosis not present

## 2018-07-15 DIAGNOSIS — Z6827 Body mass index (BMI) 27.0-27.9, adult: Secondary | ICD-10-CM | POA: Diagnosis not present

## 2018-07-15 DIAGNOSIS — Z01419 Encounter for gynecological examination (general) (routine) without abnormal findings: Secondary | ICD-10-CM | POA: Diagnosis not present

## 2018-08-07 ENCOUNTER — Ambulatory Visit
Admission: RE | Admit: 2018-08-07 | Discharge: 2018-08-07 | Disposition: A | Discharge status: Home / Self Care | Payer: 59 | Source: Ambulatory Visit | Attending: Obstetrics and Gynecology | Admitting: Obstetrics and Gynecology

## 2018-08-07 DIAGNOSIS — Z1231 Encounter for screening mammogram for malignant neoplasm of breast: Secondary | ICD-10-CM

## 2018-08-13 DIAGNOSIS — R1013 Epigastric pain: Secondary | ICD-10-CM | POA: Diagnosis not present

## 2018-08-13 DIAGNOSIS — K219 Gastro-esophageal reflux disease without esophagitis: Secondary | ICD-10-CM | POA: Diagnosis not present

## 2018-08-13 DIAGNOSIS — Z800 Family history of malignant neoplasm of digestive organs: Secondary | ICD-10-CM | POA: Diagnosis not present

## 2018-12-06 DIAGNOSIS — D1801 Hemangioma of skin and subcutaneous tissue: Secondary | ICD-10-CM | POA: Diagnosis not present

## 2018-12-06 DIAGNOSIS — L814 Other melanin hyperpigmentation: Secondary | ICD-10-CM | POA: Diagnosis not present

## 2018-12-06 DIAGNOSIS — L438 Other lichen planus: Secondary | ICD-10-CM | POA: Diagnosis not present

## 2018-12-06 DIAGNOSIS — L821 Other seborrheic keratosis: Secondary | ICD-10-CM | POA: Diagnosis not present

## 2018-12-16 ENCOUNTER — Other Ambulatory Visit: Payer: Self-pay

## 2018-12-16 MED ORDER — METOPROLOL TARTRATE 25 MG PO TABS: 12.5000 mg | ORAL_TABLET | Freq: Two times a day (BID) | ORAL | 0 refills | Status: DC

## 2019-02-03 ENCOUNTER — Telehealth: Payer: Self-pay | Admitting: *Deleted

## 2019-02-03 NOTE — Telephone Encounter (Signed)
Video visit-May [id number] Consent obtained on May 11,2020  Virtual Visit Pre-Appointment Phone Call  "(Name), I am calling you today to discuss your upcoming appointment. We are currently trying to limit exposure to the virus that causes COVID-19 by seeing patients at home rather than in the office."  "What is the BEST phone number to call the day of the visit?" - [phone]  1. "Do you have or have access to (through a family member/friend) a smartphone with video capability that we can use for your visit?"yes a. If yes - list this number in appt notes as "cell" (if different from BEST phone #) and list the appointment type as a VIDEO visit in appointment notes b. If no - list the appointment type as a PHONE visit in appointment notes  2. Confirm consent - "In the setting of the current Covid19 crisis, you are scheduled for a video visit with your provider on May 14,2020.   Just as we do with many in-office visits, in order for you to participate in this visit, we must obtain consent.  If you'd like, I can send this to your mychart (if signed up) or email for you to review.  Otherwise, I can obtain your verbal consent now.  All virtual visits are billed to your insurance company just like a normal visit would be.  By agreeing to a virtual visit, we'd like you to understand that the technology does not allow for your provider to perform an examination, and thus may limit your provider's ability to fully assess your condition. If your provider identifies any concerns that need to be evaluated in person, we will make arrangements to do so.  Finally, though the technology is pretty good, we cannot assure that it will always work on either your or our end, and in the setting of a video visit, we may have to convert it to a phone-only visit.  In either situation, we cannot ensure that we have a secure connection.  Are you willing to proceed?" STAFF: Did the patient verbally acknowledge consent to  telehealth visit? Document YES/NO here: yes  3. Advise patient to be prepared - "Two hours prior to your appointment, go ahead and check your blood pressure, pulse, oxygen saturation, and your weight (if you have the equipment to check those) and write them all down. When your visit starts, your provider will ask you for this information. If you have an Apple Watch or Kardia device, please plan to have heart rate information ready on the day of your appointment. Please have a pen and paper handy nearby the day of the visit as well."  4. Give patient instructions for MyChart download to smartphone OR Doximity/Doxy.me as below if video visit (depending on what platform provider is using)  5. Inform patient they will receive a phone call 15 minutes prior to their appointment time (may be from unknown caller ID) so they should be prepared to answer    TELEPHONE CALL NOTE  Brenda Chavez has been deemed a candidate for a follow-up tele-health visit to limit community exposure during the Covid-19 pandemic. I spoke with the patient via phone to ensure availability of phone/video source, confirm preferred email & phone number, and discuss instructions and expectations.  I reminded Brenda Chavez to be prepared with any vital sign and/or heart rhythm information that could potentially be obtained via home monitoring, at the time of her visit. I reminded Brenda Chavez to expect a phone call prior  to her visit.  Leodis Liverpool, RN 02/03/2019 3:19 PM   INSTRUCTIONS FOR DOWNLOADING THE MYCHART APP TO SMARTPHONE  - The patient must first make sure to have activated MyChart and know their login information - If Apple, go to [employer] and type in MyChart in the search bar and download the app. If Android, ask patient to go to [employer] and type in [place] in the search bar and download the app. The app is free but as with any other app downloads, their phone may require them to verify saved  payment information or Apple/Android password.  - The patient will need to then log into the app with their MyChart username and password, and select [facility] as their healthcare provider to link the account. When it is time for your visit, go to the MyChart app, find appointments, and click Begin Video Visit. Be sure to Select Allow for your device to access the Microphone and Camera for your visit. You will then be connected, and your provider will be with you shortly.  **If they have any issues connecting, or need assistance please contact MyChart service desk [phone])**  **If using a computer, in order to ensure the best quality for their visit they will need to use either of the following Internet Browsers: [employer], or Google Chrome**  IF USING DOXIMITY or DOXY.ME - The patient will receive a link just prior to their visit by text.     FULL LENGTH CONSENT FOR TELE-HEALTH VISIT   I hereby voluntarily request, consent and authorize [facility] and its employed or contracted physicians, physician assistants, nurse practitioners or other licensed health care professionals (the Practitioner), to provide me with telemedicine health care services (the "Services") as deemed necessary by the treating Practitioner. I acknowledge and consent to receive the Services by the Practitioner via telemedicine. I understand that the telemedicine visit will involve communicating with the Practitioner through live audiovisual communication technology and the disclosure of certain medical information by electronic transmission. I acknowledge that I have been given the opportunity to request an in-person assessment or other available alternative prior to the telemedicine visit and am voluntarily participating in the telemedicine visit.  I understand that I have the right to withhold or withdraw my consent to the use of telemedicine in the course of my care at any time, without affecting  my right to future care or treatment, and that the Practitioner or I may terminate the telemedicine visit at any time. I understand that I have the right to inspect all information obtained and/or recorded in the course of the telemedicine visit and may receive copies of available information for a reasonable fee.  I understand that some of the potential risks of receiving the Services via telemedicine include:  Marland Kitchen Delay or interruption in medical evaluation due to technological equipment failure or disruption; . Information transmitted may not be sufficient (e.g. poor resolution of images) to allow for appropriate medical decision making by the Practitioner; and/or  . In rare instances, security protocols could fail, causing a breach of personal health information.  Furthermore, I acknowledge that it is my responsibility to provide information about my medical history, conditions and care that is complete and accurate to the best of my ability. I acknowledge that Practitioner's advice, recommendations, and/or decision may be based on factors not within their control, such as incomplete or inaccurate data provided by me or distortions of diagnostic images or specimens that may result from electronic transmissions.  I understand that the practice of medicine is not an exact science and that Practitioner makes no warranties or guarantees regarding treatment outcomes. I acknowledge that I will receive a copy of this consent concurrently upon execution via email to the email address I last provided but may also request a printed copy by calling the office of [facility].    I understand that my insurance will be billed for this visit.   I have read or had this consent read to me. . I understand the contents of this consent, which adequately explains the benefits and risks of the Services being provided via telemedicine.  . I have been provided ample opportunity to ask questions regarding this consent and the  Services and have had my questions answered to my satisfaction. . I give my informed consent for the services to be provided through the use of telemedicine in my medical care  By participating in this telemedicine visit I agree to the above.

## 2019-02-05 NOTE — Progress Notes (Signed)
Virtual Visit via Video Note   This visit type was conducted due to national recommendations for restrictions regarding the COVID-19 Pandemic (e.g. social distancing) in an effort to limit this patient's exposure and mitigate transmission in our community.  Due to her co-morbid illnesses, this patient is at least at moderate risk for complications without adequate follow up.  This format is felt to be most appropriate for this patient at this time.  All issues noted in this document were discussed and addressed.  A limited physical exam was performed with this format.  Please refer to the patient's chart for her consent to telehealth for [facility].   Date:  02/06/2019   ID:  Brenda Chavez, DOB 06-Mar-1961, MRN [id number]  Patient Location: Home Provider Location: Home  PCP:  Jearld Fenton, NP  Cardiologist:  Lauree Chandler, MD  Electrophysiologist:  None   Evaluation Performed:  Follow-Up Visit  Chief Complaint:  Follow up- CAD  History of Present Illness:    Brenda Chavez is a 58 y.o. female with history of CAD and hyperlipidemia who is being seen today by virtual e-visit due to the Covid19 pandemic.She was admitted to [facility] August 2016 with a NSTEMI and found to have a severe stenosis in the Diagonal branch treated with a drug eluting stent. Echo 2016 with normal LV function, mild MR.   The patient denies chest pain, dyspnea, palpitations, dizziness, near syncope or syncope. No lower extremity edema.   The patient does not have symptoms concerning for COVID-19 infection (fever, chills, cough, or new shortness of breath).    Past Medical History:  Diagnosis Date  . Anxiety   . CAD (coronary artery disease)    a. 04/2015 NSTEMI/PCI: LM nl, LAD 30d, D1 100 (2.25x12 Promus Premier DES), D2 small, RI small, LCX nl, OM1/2 nl, RCA/PDA nl, EF 45-50%.  . Hyperlipidemia LDL goal <70 05/15/2015  . Osteoporosis    Past Surgical History:  Procedure Laterality Date  .  CARDIAC CATHETERIZATION N/A 05/14/2015   Procedure: Left Heart Cath and Coronary Angiography;  Surgeon: Burnell Blanks, MD;  Location: [facility];  Service: Cardiovascular;  Laterality: N/A;  . CARDIAC CATHETERIZATION N/A 05/14/2015   Procedure: Coronary Stent Intervention;  Surgeon: Burnell Blanks, MD;  Location: [facility];  Service: Cardiovascular;  Laterality: N/A;  . WISDOM TOOTH EXTRACTION  1984       Current Outpatient Medications:  .  acetaminophen (TYLENOL) 325 MG tablet, Take 2 tablets (650 mg total) by mouth every 4 (four) hours as needed for headache or mild pain., Disp: , Rfl:  .  alendronate (FOSAMAX) 70 MG tablet, Take 70 mg by mouth once a week. , Disp: , Rfl:  .  AMBULATORY NON FORMULARY MEDICATION, Medication Name: , Disp: , Rfl:  .  aspirin EC 81 MG tablet, Take 1 tablet (81 mg total) by mouth daily., Disp: 90 tablet, Rfl: 3 .  atorvastatin (LIPITOR) 20 MG tablet, Take 1 tablet (20 mg total) by mouth daily., Disp: 90 tablet, Rfl: 3 .  cholecalciferol (VITAMIN D) 1000 UNITS tablet, Take 1,000 Units by mouth daily., Disp: , Rfl:  .  CINNAMON PO, Take 1 capsule by mouth 2 (two) times daily., Disp: , Rfl:  .  citalopram (CELEXA) 40 MG tablet, TAKE 1 TABLET BY MOUTH DAILY. MUST SCHEDULE ANNUAL EXAM FOR MORE REFILLS, Disp: 30 tablet, Rfl: 1 .  esomeprazole (NEXIUM) 20 MG capsule, Take 20 mg by mouth  daily., Disp: , Rfl:  .  MAGNESIUM CITRATE PO, Take 1 capsule by mouth daily., Disp: , Rfl:  .  metoprolol tartrate (LOPRESSOR) 25 MG tablet, Take 0.5 tablets (12.5 mg total) by mouth 2 (two) times daily., Disp: 90 tablet, Rfl: 0 .  nitroGLYCERIN (NITROSTAT) 0.4 MG SL tablet, Place 1 tablet (0.4 mg total) under the tongue every 5 (five) minutes x 3 doses as needed for chest pain., Disp: 25 tablet, Rfl: 6  Allergies:   Erythromycin; Floxin [ofloxacin]; and Macrobid [nitrofurantoin macrocrystal]   Social History   Tobacco Use  . Smoking status: Never  Smoker  . Smokeless tobacco: Never Used  Substance Use Topics  . Alcohol use: No  . Drug use: No     Family Hx: The patient's family history includes Breast cancer (age of onset: 16) in her cousin and maternal aunt; COPD in her father; Colon cancer (age of onset: 5) in her cousin; Diabetes in her maternal aunt; Heart attack in her maternal grandmother and paternal grandmother; Heart disease in her father, mother, and paternal aunt.  ROS:   Please see the history of present illness.    All other systems reviewed and are negative.   Prior CV studies:   The following studies were reviewed today:  Cardiac cath 05/14/15:      Left Anterior Descending  The vessel is large .   Marland Kitchen Dist LAD lesion, 30% stenosed. discrete .   Marland Kitchen First Diagonal Branch   The vessel is moderate in size.   Colon Flattery 1st Diag to 1st Diag lesion, 100% stenosed. thrombotic .   Marland Kitchen PCI: There is no pre-interventional antegrade distal flow (TIMI 0). Pre-stent angioplasty was performed. A drug-eluting stent was placed. The strut is apposed. Post-stent angioplasty was performed. The post-interventional distal flow is normal (TIMI 3). The intervention was successful. No complications occurred at this lesion.  . There is no residual stenosis post intervention.     . Second Diagonal Branch   The vessel is small in size.      Ramus Intermedius  The vessel is small .         Left Circumflex  The vessel is large is angiographically normal.   . First Obtuse Marginal Branch   The vessel is small in size.   Marland Kitchen Second Obtuse Marginal Branch   The vessel is moderate in size.         Right Coronary Artery  The vessel is moderate in size is angiographically normal.   . Right Posterior Descending Artery   The vessel is moderate in size and is angiographically normal.     Echo 08/02/15: Left ventricle: The cavity size was normal. Wall thickness was  normal. Systolic function was normal. The estimated  ejection  fraction was in the range of 55% to 60%. Wall motion was normal;  there were no regional wall motion abnormalities. Left  ventricular diastolic function parameters were normal. - Aortic valve: There was no stenosis. - Mitral valve: There was mild regurgitation. - Right ventricle: The cavity size was normal. Systolic function  was normal. - Pulmonary arteries: No complete TR doppler jet so unable to  estimate PA systolic pressure. - Inferior vena cava: The vessel was normal in size. The  respirophasic diameter changes were in the normal range (>= 50%),  consistent with normal central venous pressure.  Impressions:  - Normal LV size with EF 55-60%. Normal diastolic function. Normal  RV size and systolic function. Mild MR.  Labs/Other Tests and Data Reviewed:    EKG:  No ECG reviewed.  Recent Labs: No results found for requested labs within last 8760 hours.   Recent Lipid Panel Lab Results  Component Value Date/Time   CHOL 100 (L) 07/07/2015 08:13 AM   TRIG 79 07/07/2015 08:13 AM   HDL 67 07/07/2015 08:13 AM   CHOLHDL 1.5 07/07/2015 08:13 AM   LDLCALC 17 07/07/2015 08:13 AM    Wt Readings from Last 3 Encounters:  02/06/19 154 lb (69.9 kg)  02/20/18 163 lb (73.9 kg)  02/05/17 153 lb (69.4 kg)     Objective:    Vital Signs:  Ht 5\' 4"  (1.626 m)   Wt 154 lb (69.9 kg)   BMI 26.43 kg/m    VITAL SIGNS:  reviewed GEN:  no acute distress  ASSESSMENT & PLAN:    1.CAD without angina: She had a NSTEMI in August 2016 and had a DES placed in the Diagonal branch. No chest pain. Will continue ASA, statin and beta blocker.     2. Hyperlipidemia: Lipids checked in Gyn clinic. Continue statin.   COVID-19 Education: The signs and symptoms of COVID-19 were discussed with the patient and how to seek care for testing (follow up with PCP or arrange E-visit).  The importance of social distancing was discussed today.  Time:   Today, I have spent 10 minutes  with the patient with telehealth technology discussing the above problems.  Additional time spent preparing the chart and finalizing the note. (total 20 minutes)   Medication Adjustments/Labs and Tests Ordered: Current medicines are reviewed at length with the patient today.  Concerns regarding medicines are outlined above.   Tests Ordered: No orders of the defined types were placed in this encounter.   Medication Changes: No orders of the defined types were placed in this encounter.   Disposition:  Follow up in 6 month(s)  Signed, Lauree Chandler, MD  02/06/2019 9:06 AM    [place]

## 2019-02-06 ENCOUNTER — Telehealth (INDEPENDENT_AMBULATORY_CARE_PROVIDER_SITE_OTHER): Payer: Self-pay | Admitting: Cardiovascular Disease

## 2019-02-06 ENCOUNTER — Encounter: Payer: Self-pay | Admitting: Cardiovascular Disease

## 2019-02-06 ENCOUNTER — Other Ambulatory Visit: Payer: Self-pay

## 2019-02-06 VITALS — Ht 64.0 in | Wt 154.0 lb

## 2019-02-06 DIAGNOSIS — E7800 Pure hypercholesterolemia, unspecified: Secondary | ICD-10-CM

## 2019-02-06 DIAGNOSIS — I2510 Atherosclerotic heart disease of native coronary artery without angina pectoris: Secondary | ICD-10-CM

## 2019-02-06 NOTE — Patient Instructions (Addendum)

## 2019-02-21 ENCOUNTER — Ambulatory Visit: Payer: 59 | Admitting: Cardiovascular Disease

## 2019-04-02 IMAGING — MG DIGITAL SCREENING BILATERAL MAMMOGRAM WITH TOMO AND CAD
8 series · 9 of 24 positions shown · non-contrast
Comparison: Previous exam(s).

CLINICAL DATA: Screening.

EXAM:
DIGITAL SCREENING BILATERAL MAMMOGRAM WITH TOMO AND CAD

[R MLO synth-2D]
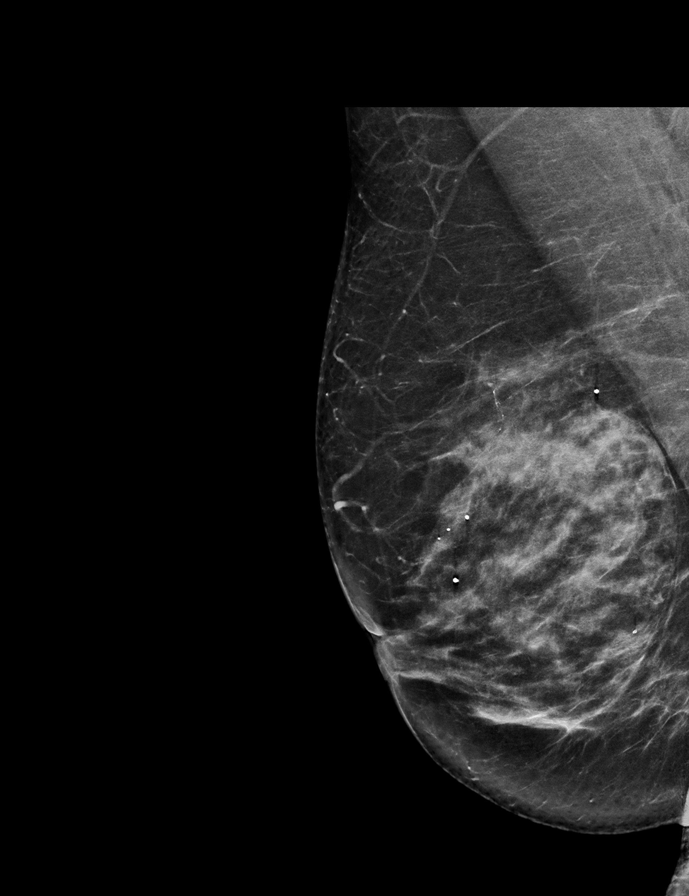

[L MLO synth-2D]
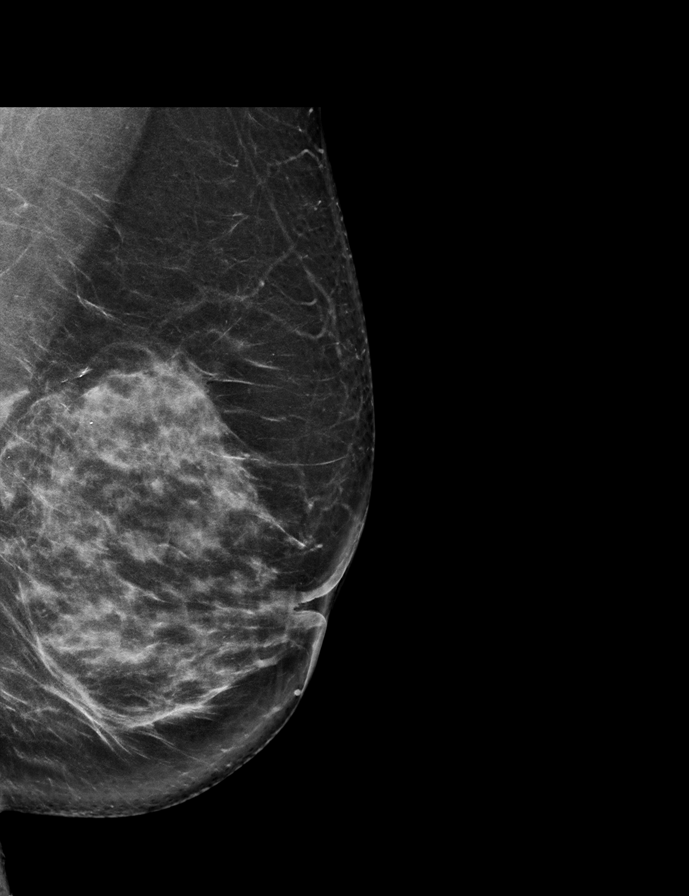

[R CC synth-2D]
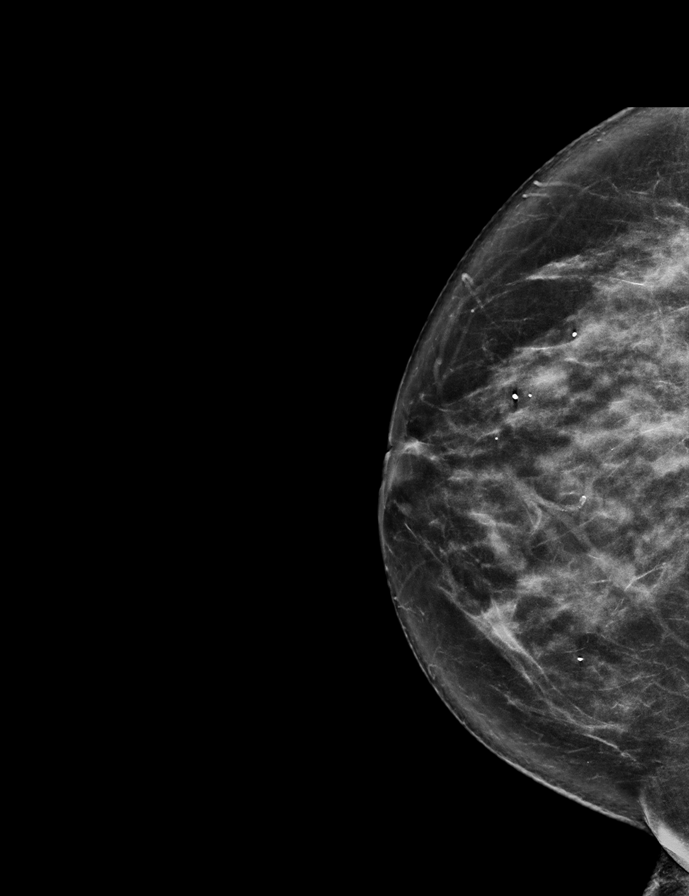

[L CC synth-2D]
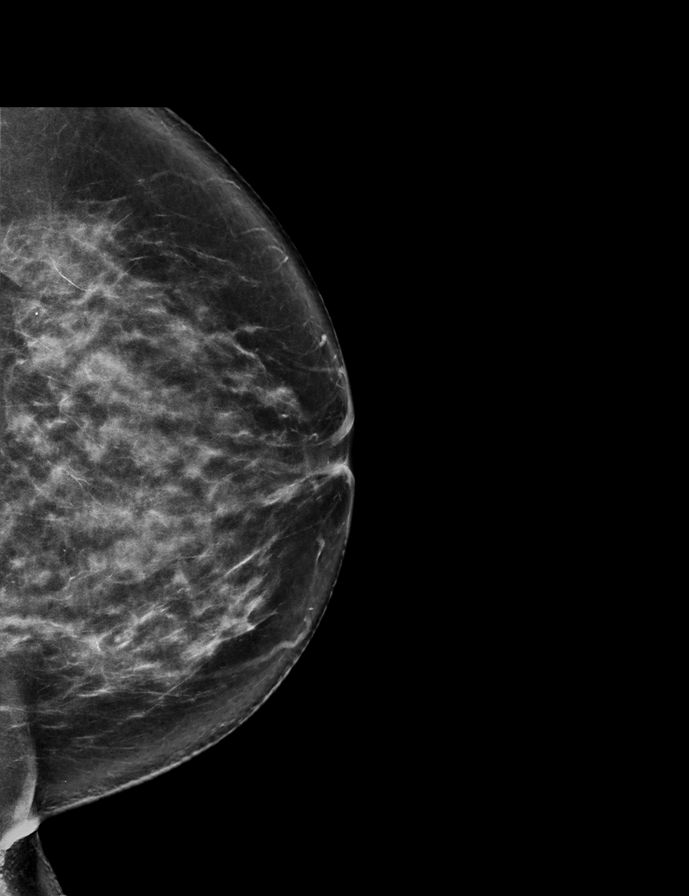

[R CC tomo · 2 of 74 frames shown]
[frame 24/74]
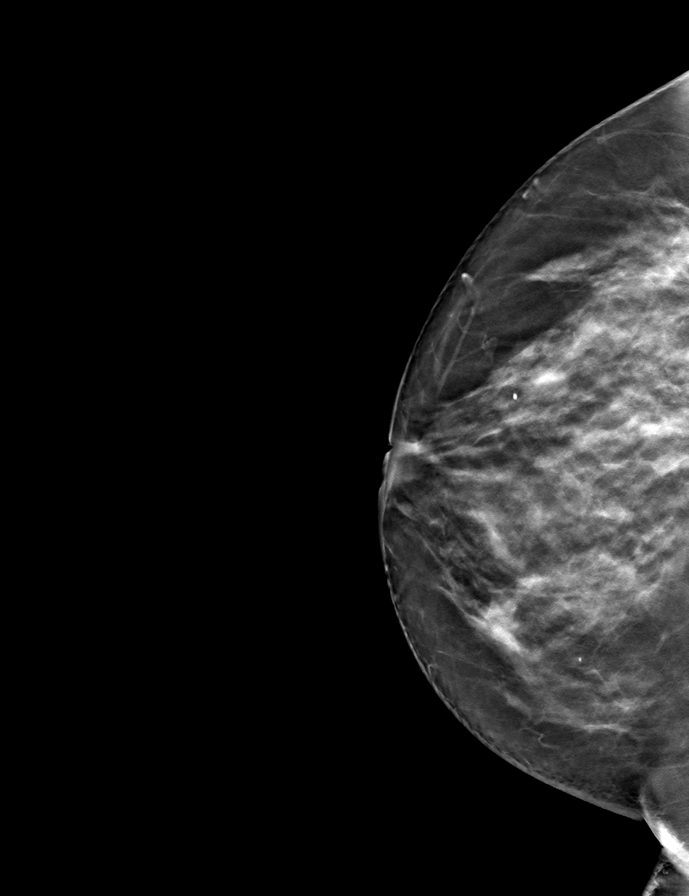
[frame 37/74]
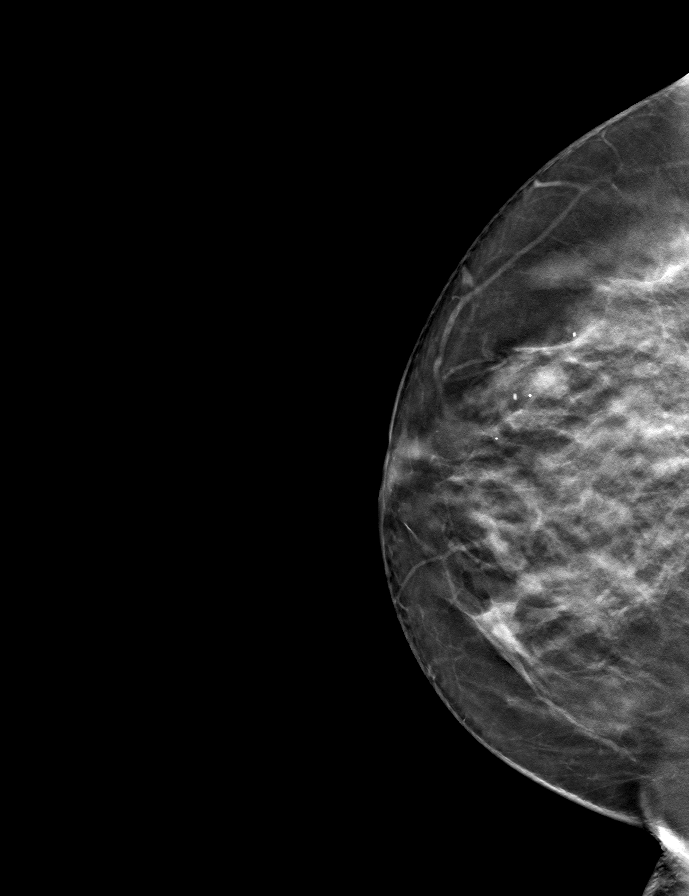

[L MLO tomo · tomo slice 41/81.0]
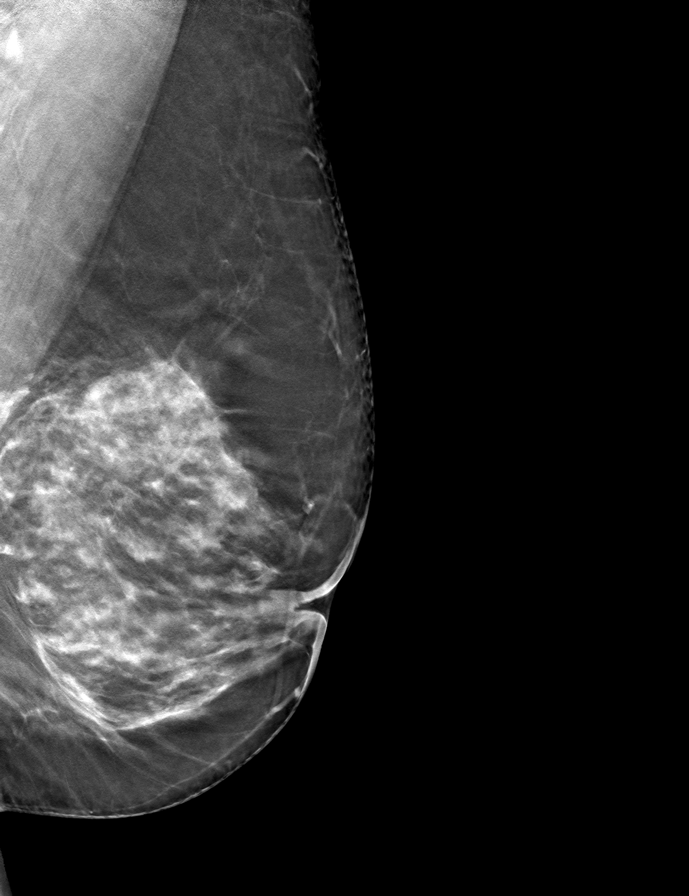

[R MLO tomo · tomo slice 39/78.0]
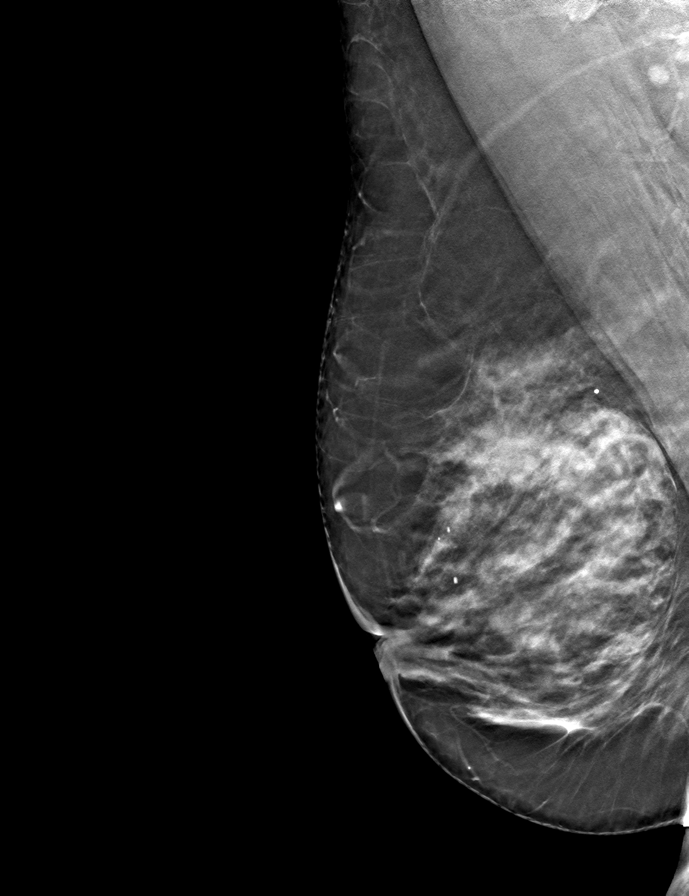

[L CC tomo · tomo slice 41/80.0]
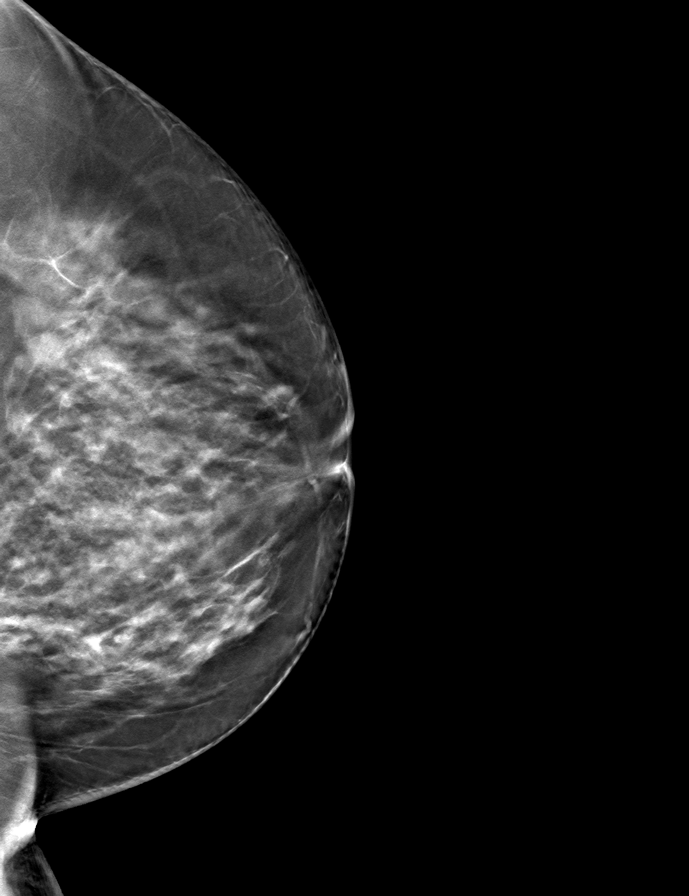

[9 of 24 positions shown; findings below may reference images not displayed]

ACR Breast Density Category c: The breast tissue is heterogeneously
dense, which may obscure small masses.
FINDINGS: There are no findings suspicious for malignancy. Images were
processed with CAD.
IMPRESSION: No mammographic evidence of malignancy. A result letter of this
screening mammogram will be mailed directly to the patient.

RECOMMENDATION:
Screening mammogram in one year. (Code:[id number])

BI-RADS CATEGORY  1: Negative.

## 2019-04-21 ENCOUNTER — Other Ambulatory Visit: Payer: Self-pay | Admitting: *Deleted

## 2019-04-21 MED ORDER — ATORVASTATIN CALCIUM 20 MG PO TABS: 20.0000 mg | ORAL_TABLET | Freq: Every day | ORAL | 2 refills | Status: DC

## 2019-04-29 ENCOUNTER — Other Ambulatory Visit: Payer: Self-pay | Admitting: Cardiovascular Disease

## 2019-05-05 ENCOUNTER — Telehealth: Payer: Self-pay

## 2019-05-05 NOTE — Telephone Encounter (Signed)
Pt left v/m requesting abx for UTI; pt last seen 12/07/16. I spoke with pt and scheduled virtual appt on 05/06/19 at 2:15 with R Baity NP and pt will come to [facility] on 05/06/19 at 12:30 and come inside to leave urine specimen. Pt does not have a way to ck temp or BP but will have a wt when CMA calls prior to virtual appt.Pt was offered an appt today but she said she was too busy. Pt has no covid symptoms, no travel and no known exposure to + covid.  Pt said for several days pt has had burning and pain upon urination but due to taking care of her mother she has not been able to take care of herself. Pt said she has had a lot of UTI symptoms and just needs abx. Advised pt needs appt and U/A with possible C&S. Pt voiced understanding. FYI to Avie Echevaria NP.

## 2019-05-05 NOTE — Telephone Encounter (Signed)
Noted, will discuss at upcoming appt 

## 2019-05-06 ENCOUNTER — Ambulatory Visit (INDEPENDENT_AMBULATORY_CARE_PROVIDER_SITE_OTHER): Payer: Self-pay | Admitting: Internal Medicine

## 2019-05-06 ENCOUNTER — Other Ambulatory Visit: Payer: Self-pay

## 2019-05-06 ENCOUNTER — Encounter: Payer: Self-pay | Admitting: Internal Medicine

## 2019-05-06 DIAGNOSIS — R300 Dysuria: Secondary | ICD-10-CM

## 2019-05-06 DIAGNOSIS — R102 Pelvic and perineal pain: Secondary | ICD-10-CM

## 2019-05-06 LAB — POC URINALSYSI DIPSTICK (AUTOMATED)
Bilirubin, UA: NEGATIVE
Blood, UA: NEGATIVE
Glucose, UA: NEGATIVE
Ketones, UA: NEGATIVE
Nitrite, UA: NEGATIVE
Protein, UA: NEGATIVE
Spec Grav, UA: 1.01 (ref 1.010–1.025)
Urobilinogen, UA: 0.2 E.U./dL
pH, UA: >=9 — AB (ref 5.0–8.0)

## 2019-05-06 MED ORDER — CEPHALEXIN 500 MG PO CAPS: 500.0000 mg | ORAL_CAPSULE | Freq: Two times a day (BID) | ORAL | 0 refills | Status: DC

## 2019-05-06 MED ORDER — FLUCONAZOLE 150 MG PO TABS: 150.0000 mg | ORAL_TABLET | Freq: Once | ORAL | 0 refills | Status: AC

## 2019-05-06 NOTE — Patient Instructions (Signed)

## 2019-05-06 NOTE — Progress Notes (Signed)
Virtual Visit via Video Note  I connected with Brenda Chavez on 05/06/19 at  2:15 PM EDT by a video enabled telemedicine application and verified that I am speaking with the correct person using two identifiers.  Location: Patient: Home Provider: Office   I discussed the limitations of evaluation and management by telemedicine and the availability of in person appointments. The patient expressed understanding and agreed to proceed.  HPI  Pt presents to the clinic today with c/o dysuria, external vaginal irritation and suprapubic pain. This started 1 week ago. She denies urgency, frequency or blood in urine. She denies external itching, vaginal discharge, odor or abnormal bleeding. She denies fever, chills, nausea or low back pain. She has not tried anything OTC.   Review of Systems  Past Medical History:  Diagnosis Date  . Anxiety   . CAD (coronary artery disease)    a. 04/2015 NSTEMI/PCI: LM nl, LAD 30d, D1 100 (2.25x12 Promus Premier DES), D2 small, RI small, LCX nl, OM1/2 nl, RCA/PDA nl, EF 45-50%.  . Hyperlipidemia LDL goal <70 05/15/2015  . Osteoporosis     Family History  Problem Relation Age of Onset  . Colon cancer Cousin 21  . Breast cancer Cousin 21  . Heart disease Mother   . Heart disease Father   . COPD Father   . Heart attack Maternal Grandmother   . Heart attack Paternal Grandmother   . Breast cancer Maternal Aunt 41  . Diabetes Maternal Aunt   . Heart disease Paternal Aunt     Social History   Socioeconomic History  . Marital status: Married    Spouse name: Not on file  . Number of children: Not on file  . Years of education: Not on file  . Highest education level: Not on file  Occupational History  . Not on file  Social Needs  . Financial resource strain: Not on file  . Food insecurity    Worry: Not on file    Inability: Not on file  . Transportation needs    Medical: Not on file    Non-medical: Not on file  Tobacco Use  . Smoking status:  Never Smoker  . Smokeless tobacco: Never Used  Substance and Sexual Activity  . Alcohol use: No  . Drug use: No  . Sexual activity: Yes    Birth control/protection: Post-menopausal  Lifestyle  . Physical activity    Days per week: Not on file    Minutes per session: Not on file  . Stress: Not on file  Relationships  . Social Herbalist on phone: Not on file    Gets together: Not on file    Attends religious service: Not on file    Active member of club or organization: Not on file    Attends meetings of clubs or organizations: Not on file    Relationship status: Not on file  . Intimate partner violence    Fear of current or ex partner: Not on file    Emotionally abused: Not on file    Physically abused: Not on file    Forced sexual activity: Not on file  Other Topics Concern  . Not on file  Social History Narrative  . Not on file    Allergies  Allergen Reactions  . Erythromycin Nausea And Vomiting  . Floxin [Ofloxacin] Other (See Comments)    Unable to close eyes  . Macrobid [Nitrofurantoin Macrocrystal] Nausea And Vomiting     Constitutional: Denies  fever, malaise, fatigue, headache or abrupt weight changes.   GU: Pt reports suprapubic pain, external vaginal irritation, and  pain with urination. Denies burning sensation, blood in urine, odor or discharge. Skin: Denies redness, rashes, lesions or ulcercations.   No other specific complaints in a complete review of systems (except as listed in HPI above).    Objective:   Physical Exam   Wt Readings from Last 3 Encounters:  02/06/19 154 lb (69.9 kg)  02/20/18 163 lb (73.9 kg)  02/05/17 153 lb (69.4 kg)    General: Appears her stated age, well developed, well nourished in NAD. Pulmonary/Chest: Normal effort. No respiratory distress.   Abdomen: No obvious distention or masses noted.         Assessment & Plan:   Suprapubic Pain, Vaginal Pain, Dysuria:  Urinalysis: 1+ leuks Will send urine  culture eRx sent if for Keflex 500 mg BID x 5 days RX for Diflucan 150 mg PO every 3 days x 2 for possible yeast OK to take AZO OTC Drink plenty of fluids  RTC as needed or if symptoms persist. Webb Silversmith, NP    I discussed the assessment and treatment plan with the patient. The patient was provided an opportunity to ask questions and all were answered. The patient agreed with the plan and demonstrated an understanding of the instructions.   The patient was advised to call back or seek an in-person evaluation if the symptoms worsen or if the condition fails to improve as anticipated.     Webb Silversmith, NP

## 2019-05-06 NOTE — Addendum Note (Signed)
Addended by: Lurlean Nanny on: 05/06/2019 04:39 PM   Modules accepted: Orders

## 2019-05-07 LAB — URINE CULTURE
MICRO NUMBER:: 758961
SPECIMEN QUALITY:: ADEQUATE

## 2019-09-01 ENCOUNTER — Other Ambulatory Visit: Payer: Self-pay | Admitting: Cardiovascular Disease

## 2019-09-01 MED ORDER — METOPROLOL TARTRATE 25 MG PO TABS: 12.5000 mg | ORAL_TABLET | Freq: Two times a day (BID) | ORAL | 1 refills | Status: DC

## 2019-09-26 HISTORY — PX: DILATION AND CURETTAGE OF UTERUS: SHX78

## 2019-10-07 DIAGNOSIS — Z01419 Encounter for gynecological examination (general) (routine) without abnormal findings: Secondary | ICD-10-CM | POA: Diagnosis not present

## 2019-10-07 DIAGNOSIS — Z6828 Body mass index (BMI) 28.0-28.9, adult: Secondary | ICD-10-CM | POA: Diagnosis not present

## 2019-10-21 DIAGNOSIS — R87618 Other abnormal cytological findings on specimens from cervix uteri: Secondary | ICD-10-CM | POA: Diagnosis not present

## 2019-10-21 DIAGNOSIS — R9389 Abnormal findings on diagnostic imaging of other specified body structures: Secondary | ICD-10-CM | POA: Diagnosis not present

## 2019-10-22 ENCOUNTER — Other Ambulatory Visit: Payer: Self-pay | Admitting: Obstetrics and Gynecology

## 2019-10-22 DIAGNOSIS — R87619 Unspecified abnormal cytological findings in specimens from cervix uteri: Secondary | ICD-10-CM | POA: Diagnosis not present

## 2019-10-22 DIAGNOSIS — Z1231 Encounter for screening mammogram for malignant neoplasm of breast: Secondary | ICD-10-CM

## 2019-10-26 NOTE — Progress Notes (Signed)
Chief Complaint  Patient presents with  . Follow-up    CAD   History of Present Illness: 58 yo female with history of CAD, HLD here today for cardiac follow up. She was admitted to [facility] August 2016 with a NSTEMI and found to have a severe stenosis in the Diagonal branch treated with a drug eluting stent. Echo 2016 with normal LV function, mild MR.   She is here today for follow up. The patient denies any chest pain, dyspnea, palpitations, lower extremity edema, orthopnea, PND, dizziness, near syncope or syncope.   Primary Care Physician: Jearld Fenton, NP  Past Medical History:  Diagnosis Date  . Anxiety   . CAD (coronary artery disease)    a. 04/2015 NSTEMI/PCI: LM nl, LAD 30d, D1 100 (2.25x12 Promus Premier DES), D2 small, RI small, LCX nl, OM1/2 nl, RCA/PDA nl, EF 45-50%.  . Hyperlipidemia LDL goal <70 05/15/2015  . Osteoporosis     Past Surgical History:  Procedure Laterality Date  . CARDIAC CATHETERIZATION N/A 05/14/2015   Procedure: Left Heart Cath and Coronary Angiography;  Surgeon: Burnell Blanks, MD;  Location: [place] [facility];  Service: Cardiovascular;  Laterality: N/A;  . CARDIAC CATHETERIZATION N/A 05/14/2015   Procedure: Coronary Stent Intervention;  Surgeon: Burnell Blanks, MD;  Location: [place] [facility];  Service: Cardiovascular;  Laterality: N/A;  . WISDOM TOOTH EXTRACTION  1984   Current Medications:   Current Outpatient Medications:  .  acetaminophen (TYLENOL) 325 MG tablet, Take 2 tablets (650 mg total) by mouth every 4 (four) hours as needed for headache or mild pain., Disp: , Rfl:  .  alendronate (FOSAMAX) 70 MG tablet, Take 70 mg by mouth once a week. , Disp: , Rfl:  .  AMBULATORY NON FORMULARY MEDICATION, Medication Name:, Disp: , Rfl:  .  aspirin EC 81 MG tablet, Take 1 tablet (81 mg total) by mouth daily., Disp: 90 tablet, Rfl: 3 .  atorvastatin (LIPITOR) 20 MG tablet, Take 1 tablet (20 mg total) by mouth daily., Disp: 90 tablet,  Rfl: 2 .  cholecalciferol (VITAMIN D) 1000 UNITS tablet, Take 1,000 Units by mouth daily., Disp: , Rfl:  .  CINNAMON PO, Take 1 capsule by mouth 2 (two) times daily., Disp: , Rfl:  .  citalopram (CELEXA) 40 MG tablet, TAKE 1 TABLET BY MOUTH DAILY. MUST SCHEDULE ANNUAL EXAM FOR MORE REFILLS, Disp: 30 tablet, Rfl: 1 .  esomeprazole (NEXIUM) 20 MG capsule, Take 20 mg by mouth daily., Disp: , Rfl:  .  MAGNESIUM CITRATE PO, Take 1 capsule by mouth daily., Disp: , Rfl:  .  metoprolol tartrate (LOPRESSOR) 25 MG tablet, Take 0.5 tablets (12.5 mg total) by mouth 2 (two) times daily., Disp: 90 tablet, Rfl: 1 .  nitroGLYCERIN (NITROSTAT) 0.4 MG SL tablet, Place 1 tablet (0.4 mg total) under the tongue every 5 (five) minutes x 3 doses as needed for chest pain., Disp: 25 tablet, Rfl: 6    Allergies  Allergen Reactions  . Erythromycin Nausea And Vomiting  . Floxin [Ofloxacin] Other (See Comments)    Unable to close eyes  . Macrobid [Nitrofurantoin Macrocrystal] Nausea And Vomiting    Social History   Socioeconomic History  . Marital status: Married    Spouse name: Not on file  . Number of children: Not on file  . Years of education: Not on file  . Highest education level: Not on file  Occupational History  . Not on file  Tobacco Use  .  Smoking status: Never Smoker  . Smokeless tobacco: Never Used  Substance and Sexual Activity  . Alcohol use: No  . Drug use: No  . Sexual activity: Yes    Birth control/protection: Post-menopausal  Other Topics Concern  . Not on file  Social History Narrative  . Not on file   Social Determinants of Health   Financial Resource Strain:   . Difficulty of Paying Living Expenses: Not on file  Food Insecurity:   . Worried About Charity fundraiser in the Last Year: Not on file  . Ran Out of Food in the Last Year: Not on file  Transportation Needs:   . Lack of Transportation (Medical): Not on file  . Lack of Transportation (Non-Medical): Not on file   Physical Activity:   . Days of Exercise per Week: Not on file  . Minutes of Exercise per Session: Not on file  Stress:   . Feeling of Stress : Not on file  Social Connections:   . Frequency of Communication with Friends and Family: Not on file  . Frequency of Social Gatherings with Friends and Family: Not on file  . Attends Religious Services: Not on file  . Active Member of Clubs or Organizations: Not on file  . Attends Archivist Meetings: Not on file  . Marital Status: Not on file  Intimate Partner Violence:   . Fear of Current or Ex-Partner: Not on file  . Emotionally Abused: Not on file  . Physically Abused: Not on file  . Sexually Abused: Not on file    Family History  Problem Relation Age of Onset  . Colon cancer Cousin 43  . Breast cancer Cousin 46  . Heart disease Mother   . Heart disease Father   . COPD Father   . Heart attack Maternal Grandmother   . Heart attack Paternal Grandmother   . Breast cancer Maternal Aunt 96  . Diabetes Maternal Aunt   . Heart disease Paternal Aunt     Review of Systems:  As stated in the HPI and otherwise negative.   BP 134/80   Pulse 68   Ht 5\' 4"  (1.626 m)   Wt 170 lb (77.1 kg)   SpO2 95%   BMI 29.18 kg/m   Physical Examination:  General: Well developed, well nourished, NAD  HEENT: OP clear, mucus membranes moist  SKIN: warm, dry. No rashes. Neuro: No focal deficits  Musculoskeletal: Muscle strength 5/5 all ext  Psychiatric: Mood and affect normal  Neck: No JVD, no carotid bruits, no thyromegaly, no lymphadenopathy.  Lungs:Clear bilaterally, no wheezes, rhonci, crackles Cardiovascular: Regular rate and rhythm. No murmurs, gallops or rubs. Abdomen:Soft. Bowel sounds present. Non-tender.  Extremities: No lower extremity edema. Pulses are 2 + in the bilateral DP/PT.  Cardiac cath 05/14/15: Left Anterior Descending  The vessel is large .   Marland Kitchen Dist LAD lesion, 30% stenosed. discrete .   Marland Kitchen First Diagonal Branch    The vessel is moderate in size.   Colon Flattery 1st Diag to 1st Diag lesion, 100% stenosed. thrombotic .   Marland Kitchen PCI: There is no pre-interventional antegrade distal flow (TIMI 0). Pre-stent angioplasty was performed. A drug-eluting stent was placed. The strut is apposed. Post-stent angioplasty was performed. The post-interventional distal flow is normal (TIMI 3). The intervention was successful. No complications occurred at this lesion.  . There is no residual stenosis post intervention.     . Second Diagonal Branch   The vessel is small in  size.      Ramus Intermedius  The vessel is small .     Left Circumflex  The vessel is large is angiographically normal.   . First Obtuse Marginal Branch   The vessel is small in size.   Marland Kitchen Second Obtuse Marginal Branch   The vessel is moderate in size.     Right Coronary Artery  The vessel is moderate in size is angiographically normal.   . Right Posterior Descending Artery   The vessel is moderate in size and is angiographically normal.     Echo 08/02/15: Left ventricle: The cavity size was normal. Wall thickness was  normal. Systolic function was normal. The estimated ejection  fraction was in the range of 55% to 60%. Wall motion was normal;  there were no regional wall motion abnormalities. Left  ventricular diastolic function parameters were normal. - Aortic valve: There was no stenosis. - Mitral valve: There was mild regurgitation. - Right ventricle: The cavity size was normal. Systolic function  was normal. - Pulmonary arteries: No complete TR doppler jet so unable to  estimate PA systolic pressure. - Inferior vena cava: The vessel was normal in size. The  respirophasic diameter changes were in the normal range (>= 50%),  consistent with normal central venous pressure.  Impressions:  - Normal LV size with EF 55-60%. Normal diastolic function. Normal  RV size and systolic function. Mild MR.  EKG:  EKG is ordered today. The  ekg ordered today demonstrates NSR  Recent Labs: No results found for requested labs within last 8760 hours.   Lipid Panel    Component Value Date/Time   CHOL 100 (L) 07/07/2015 0813   TRIG 79 07/07/2015 0813   HDL 67 07/07/2015 0813   CHOLHDL 1.5 07/07/2015 0813   VLDL 16 07/07/2015 0813   LDLCALC 17 07/07/2015 0813     Wt Readings from Last 3 Encounters:  10/27/19 170 lb (77.1 kg)  02/06/19 154 lb (69.9 kg)  02/20/18 163 lb (73.9 kg)     Other studies Reviewed: Additional studies/ records that were reviewed today include: . Review of the above records demonstrates:    Assessment and Plan:   1. CAD without angina: She had a NSTEMI in August 2016 and had a DES placed in the Diagonal branch. No chest pain. Continue ASA, statin and beta blocker.  Will arrange an echo to assess LV function.   2. Hyperlipidemia: No recent lipids. Will check lipids and LFTs. Continue statin  Current medicines are reviewed at length with the patient today.  The patient does not have concerns regarding medicines.  The following changes have been made:  no change  Labs/ tests ordered today include:   Orders Placed This Encounter  Procedures  . Lipid Profile  . Hepatic function panel  . EKG 12-Lead  . ECHOCARDIOGRAM COMPLETE   Disposition:   FU with me in 12 months  Signed, Lauree Chandler, MD 10/27/2019 9:03 AM    [facility] [address], [place], [place]  [address] Phone: [phone]; Fax: [phone]

## 2019-10-27 ENCOUNTER — Encounter (INDEPENDENT_AMBULATORY_CARE_PROVIDER_SITE_OTHER): Payer: Self-pay

## 2019-10-27 ENCOUNTER — Other Ambulatory Visit: Payer: Self-pay

## 2019-10-27 ENCOUNTER — Ambulatory Visit: Payer: BC Managed Care – PPO | Admitting: Cardiovascular Disease

## 2019-10-27 ENCOUNTER — Encounter: Payer: Self-pay | Admitting: Cardiovascular Disease

## 2019-10-27 VITALS — BP 134/80 | HR 68 | Ht 64.0 in | Wt 170.0 lb

## 2019-10-27 DIAGNOSIS — E7800 Pure hypercholesterolemia, unspecified: Secondary | ICD-10-CM

## 2019-10-27 DIAGNOSIS — I2510 Atherosclerotic heart disease of native coronary artery without angina pectoris: Secondary | ICD-10-CM

## 2019-10-27 LAB — LIPID PANEL
Chol/HDL Ratio: 2.8 ratio (ref 0.0–4.4)
Cholesterol, Total: 194 mg/dL (ref 100–199)
HDL: 70 mg/dL (ref >39)
LDL Chol Calc (NIH): 78 mg/dL (ref 0–99)
Triglycerides: 284 mg/dL — ABNORMAL HIGH (ref 0–149)
VLDL Cholesterol Cal: 46 mg/dL — ABNORMAL HIGH (ref 5–40)

## 2019-10-27 LAB — HEPATIC FUNCTION PANEL
ALT: 40 IU/L — ABNORMAL HIGH (ref 0–32)
AST: 28 IU/L (ref 0–40)
Albumin: 4.5 g/dL (ref 3.8–4.9)
Alkaline Phosphatase: 117 IU/L (ref 39–117)
Bilirubin Total: <0.2 mg/dL (ref 0.0–1.2)
Bilirubin, Direct: 0.05 mg/dL (ref 0.00–0.40)
Total Protein: 7 g/dL (ref 6.0–8.5)

## 2019-10-27 NOTE — Patient Instructions (Addendum)
Medication Instructions:  Your physician recommends that you continue on your current medications as directed. Please refer to the Current Medication list given to you today.  *If you need a refill on your cardiac medications before your next appointment, please call your pharmacy*  Lab Work: TODAY - liver function, cholesterol If you have labs (blood work) drawn today and your tests are completely normal, you will receive your results only by: Marland Kitchen MyChart Message (if you have MyChart) OR . A paper copy in the mail If you have any lab test that is abnormal or we need to change your treatment, we will call you to review the results.   Testing/Procedures: Your physician has requested that you have an echocardiogram on Thursday Feb. 11. Echocardiography is a painless test that uses sound waves to create images of your heart. It provides your doctor with information about the size and shape of your heart and how well your heart's chambers and valves are working. This procedure takes approximately one hour. There are no restrictions for this procedure.     Follow-Up: At [facility], you and your health needs are our priority.  As part of our continuing mission to provide you with exceptional heart care, we have created designated Provider Care Teams.  These Care Teams include your primary Cardiologist (physician) and Advanced Practice Providers (APPs -  Physician Assistants and Nurse Practitioners) who all work together to provide you with the care you need, when you need it.  Your next appointment:   1 year(s)  The format for your next appointment:   In Person  Provider:   You may see Lauree Chandler, MD or one of the following Advanced Practice Providers on your designated Care Team:    Melina Copa, PA-C  Ermalinda Barrios, PA-C

## 2019-10-30 ENCOUNTER — Telehealth: Payer: Self-pay | Admitting: *Deleted

## 2019-10-30 DIAGNOSIS — E7800 Pure hypercholesterolemia, unspecified: Secondary | ICD-10-CM

## 2019-10-30 DIAGNOSIS — I2510 Atherosclerotic heart disease of native coronary artery without angina pectoris: Secondary | ICD-10-CM

## 2019-10-30 MED ORDER — ATORVASTATIN CALCIUM 40 MG PO TABS: 40.0000 mg | ORAL_TABLET | Freq: Every day | ORAL | 3 refills | Status: DC

## 2019-10-30 NOTE — Telephone Encounter (Signed)
Patient informed of results/review/recommendations.  Will increase to Lipitor 40 mg daily.  Will return for fasting lipids on 01/28/20.  New prescription to [facility].

## 2019-10-30 NOTE — Telephone Encounter (Signed)
-----   Message from Burnell Blanks, MD sent at 10/28/2019 10:22 AM EST ----- LDL is not at goal of 70. I would recommend that we increase her Lipitor to 40 mg daily and repeat lipids and LFTs in 12 weeks. Thanks, chris

## 2019-11-06 ENCOUNTER — Other Ambulatory Visit (HOSPITAL_COMMUNITY): Payer: BC Managed Care – PPO

## 2019-11-10 ENCOUNTER — Telehealth: Payer: Self-pay | Admitting: *Deleted

## 2019-11-10 ENCOUNTER — Ambulatory Visit: Payer: Self-pay

## 2019-11-10 NOTE — Telephone Encounter (Signed)
I returned call to the pt and stated we just received the clearance request for her surgery. I assured the pt that I will get everything over to our Pre Op Team for review. Pt thanked me for the help.      [facility] Pre-operative Risk Assessment    Request for surgical clearance: ALSO BEING REQUESTED IS TO SEND COPY OF ECHO THAT IS BEING DONE ON 11/11/19  1. What type of surgery is being performed? HYSTEROSCOPY, D&C, MYOSURE   2. When is this surgery scheduled? 11/20/19   3. What type of clearance is required (medical clearance vs. Pharmacy clearance to hold med vs. Both)? MEDICAL  4. Are there any medications that need to be held prior to surgery and how long? ASA    5. Practice name and name of physician performing surgery? [facility]; DR. Roselyn Bering (ANESTHESIOLOGIST), DR. Julien Girt PREFORMING PROCEDURE   6. What is your office phone number [phone]    7.   What is your office fax number [phone]  8.   Anesthesia type (None, local, MAC, general) ? GENERAL   Julaine Hua 11/10/2019, 10:58 AM  _________________________________________________________________   (provider comments below)

## 2019-11-10 NOTE — Telephone Encounter (Signed)
DR. Julien Girt IS WITH [facility]

## 2019-11-10 NOTE — Telephone Encounter (Signed)
   Primary Cardiologist: Lauree Chandler, MD  Chart reviewed as part of pre-operative protocol coverage. Given past medical history and time since last visit, based on ACC/AHA guidelines, Taline Olah would be at acceptable risk for the planned procedure without further cardiovascular testing.   OK to hold aspirin 3-5 days pre op if needed.  I will route this recommendation to the requesting party via Epic fax function and remove from pre-op pool.  Please call with questions.  Kerin Ransom, PA-C 11/10/2019, 2:02 PM

## 2019-11-11 ENCOUNTER — Other Ambulatory Visit (HOSPITAL_COMMUNITY): Payer: BC Managed Care – PPO

## 2019-11-20 DIAGNOSIS — N8500 Endometrial hyperplasia, unspecified: Secondary | ICD-10-CM | POA: Diagnosis not present

## 2019-11-20 DIAGNOSIS — N840 Polyp of corpus uteri: Secondary | ICD-10-CM | POA: Diagnosis not present

## 2019-12-09 DIAGNOSIS — Z09 Encounter for follow-up examination after completed treatment for conditions other than malignant neoplasm: Secondary | ICD-10-CM | POA: Diagnosis not present

## 2019-12-11 ENCOUNTER — Other Ambulatory Visit: Payer: Self-pay

## 2019-12-11 ENCOUNTER — Encounter (INDEPENDENT_AMBULATORY_CARE_PROVIDER_SITE_OTHER): Payer: Self-pay

## 2019-12-11 ENCOUNTER — Ambulatory Visit (HOSPITAL_COMMUNITY): Payer: BC Managed Care – PPO | Attending: Cardiovascular Disease

## 2019-12-11 DIAGNOSIS — E7800 Pure hypercholesterolemia, unspecified: Secondary | ICD-10-CM | POA: Diagnosis not present

## 2019-12-11 DIAGNOSIS — I2510 Atherosclerotic heart disease of native coronary artery without angina pectoris: Secondary | ICD-10-CM | POA: Diagnosis not present

## 2019-12-15 ENCOUNTER — Other Ambulatory Visit: Payer: Self-pay

## 2019-12-15 ENCOUNTER — Ambulatory Visit
Admission: RE | Admit: 2019-12-15 | Discharge: 2019-12-15 | Disposition: A | Discharge status: Home / Self Care | Payer: BC Managed Care – PPO | Source: Ambulatory Visit | Attending: Obstetrics and Gynecology | Admitting: Obstetrics and Gynecology

## 2019-12-15 DIAGNOSIS — Z1231 Encounter for screening mammogram for malignant neoplasm of breast: Secondary | ICD-10-CM | POA: Diagnosis not present

## 2020-01-28 ENCOUNTER — Other Ambulatory Visit: Payer: Self-pay

## 2020-01-28 ENCOUNTER — Other Ambulatory Visit: Payer: BC Managed Care – PPO | Admitting: *Deleted

## 2020-01-28 DIAGNOSIS — I2510 Atherosclerotic heart disease of native coronary artery without angina pectoris: Secondary | ICD-10-CM

## 2020-01-28 DIAGNOSIS — E7800 Pure hypercholesterolemia, unspecified: Secondary | ICD-10-CM

## 2020-01-28 LAB — LIPID PANEL
Chol/HDL Ratio: 2.2 ratio (ref 0.0–4.4)
Cholesterol, Total: 127 mg/dL (ref 100–199)
HDL: 58 mg/dL (ref >39)
LDL Chol Calc (NIH): 47 mg/dL (ref 0–99)
Triglycerides: 127 mg/dL (ref 0–149)
VLDL Cholesterol Cal: 22 mg/dL (ref 5–40)

## 2020-01-28 LAB — HEPATIC FUNCTION PANEL
ALT: 35 IU/L — ABNORMAL HIGH (ref 0–32)
AST: 25 IU/L (ref 0–40)
Albumin: 4.4 g/dL (ref 3.8–4.9)
Alkaline Phosphatase: 98 IU/L (ref 39–117)
Bilirubin Total: 0.4 mg/dL (ref 0.0–1.2)
Bilirubin, Direct: 0.11 mg/dL (ref 0.00–0.40)
Total Protein: 6.8 g/dL (ref 6.0–8.5)

## 2020-01-29 ENCOUNTER — Telehealth: Payer: Self-pay | Admitting: Cardiovascular Disease

## 2020-01-29 NOTE — Telephone Encounter (Signed)
The patient has been notified of the result and verbalized understanding.  All questions (if any) were answered. Antonieta Iba, RN 01/29/2020 8:56 AM

## 2020-01-29 NOTE — Telephone Encounter (Signed)
   Pt returning call about her lab results

## 2020-02-12 DIAGNOSIS — L814 Other melanin hyperpigmentation: Secondary | ICD-10-CM | POA: Diagnosis not present

## 2020-02-12 DIAGNOSIS — D1801 Hemangioma of skin and subcutaneous tissue: Secondary | ICD-10-CM | POA: Diagnosis not present

## 2020-02-12 DIAGNOSIS — D2262 Melanocytic nevi of left upper limb, including shoulder: Secondary | ICD-10-CM | POA: Diagnosis not present

## 2020-02-12 DIAGNOSIS — L821 Other seborrheic keratosis: Secondary | ICD-10-CM | POA: Diagnosis not present

## 2020-07-27 ENCOUNTER — Other Ambulatory Visit: Payer: Self-pay

## 2020-07-27 MED ORDER — METOPROLOL TARTRATE 25 MG PO TABS
12.5000 mg | ORAL_TABLET | Freq: Two times a day (BID) | ORAL | 0 refills | Status: DC
Start: 2020-07-27 — End: 2021-02-14

## 2020-07-27 MED ORDER — ATORVASTATIN CALCIUM 40 MG PO TABS: 40.0000 mg | ORAL_TABLET | Freq: Every day | ORAL | 0 refills | Status: DC

## 2020-09-25 DIAGNOSIS — E119 Type 2 diabetes mellitus without complications: Secondary | ICD-10-CM

## 2020-09-25 HISTORY — DX: Type 2 diabetes mellitus without complications: E11.9

## 2020-11-17 ENCOUNTER — Other Ambulatory Visit: Payer: Self-pay | Admitting: *Deleted

## 2020-11-17 ENCOUNTER — Other Ambulatory Visit: Payer: Self-pay | Admitting: Obstetrics and Gynecology

## 2020-11-17 DIAGNOSIS — Z1231 Encounter for screening mammogram for malignant neoplasm of breast: Secondary | ICD-10-CM

## 2020-11-18 LAB — HM DEXA SCAN

## 2020-11-24 ENCOUNTER — Telehealth: Payer: Self-pay

## 2020-11-24 NOTE — Telephone Encounter (Signed)
Faxed result in MYD box to contact pt

## 2020-11-24 NOTE — Telephone Encounter (Signed)
Patient left a voicemail stating that she went to her GYN last week and her blood sugar was high. Brenda Chavez stated that her GYN was sending the results to Shanon Ace NP. Patient wants to know if it was received and what she should do about it?

## 2020-11-24 NOTE — Telephone Encounter (Signed)
[facility] Night - Client Nonclinical Telephone Record  [facility] Client [facility] Night - Client Client Site [place] Physician Webb Silversmith - NP Contact Type Call Who Is Calling Patient / Member / Family / Caregiver Caller Name Samyria Rudie Caller Phone Number [phone] Patient Name Kandance Yano Patient DOB [birth year] Call Type Message Only Information Provided Reason for Call Request for General Office Information Initial Comment Caller states she went to her GYNO last week and her sugar level was high. GYNo was supposed to send this over to Dr Moreen Fowler office but she hasn't heard anything back yet. Additional Comment Declined triage. office hours provided. Disp. Time Disposition Final User 11/23/2020 5:25:51 PM General Information Provided Yes Delrae Alfred Call Closed By: Delrae Alfred Transaction Date/Time: 11/23/2020 5:22:58 PM (ET)

## 2020-11-25 LAB — HEMOGLOBIN A1C: A1c: 6.5

## 2020-11-25 NOTE — Telephone Encounter (Signed)
Left detailed msg on VM per HIPAA  Pt needs to schedule 30 min OV

## 2020-12-07 ENCOUNTER — Encounter: Payer: Self-pay | Admitting: Internal Medicine

## 2020-12-07 ENCOUNTER — Other Ambulatory Visit: Payer: Self-pay

## 2020-12-07 ENCOUNTER — Ambulatory Visit (INDEPENDENT_AMBULATORY_CARE_PROVIDER_SITE_OTHER): Payer: 59 | Admitting: Internal Medicine

## 2020-12-07 VITALS — BP 124/80 | HR 68 | Temp 97.6°F | Wt 160.0 lb

## 2020-12-07 DIAGNOSIS — E119 Type 2 diabetes mellitus without complications: Secondary | ICD-10-CM | POA: Insufficient documentation

## 2020-12-07 DIAGNOSIS — E785 Hyperlipidemia, unspecified: Secondary | ICD-10-CM

## 2020-12-07 DIAGNOSIS — Z23 Encounter for immunization: Secondary | ICD-10-CM

## 2020-12-07 NOTE — Addendum Note (Signed)
Addended by: Lurlean Nanny on: 12/07/2020 02:29 PM   Modules accepted: Orders

## 2020-12-07 NOTE — Patient Instructions (Signed)

## 2020-12-07 NOTE — Progress Notes (Signed)
Subjective:    Patient ID: Brenda Chavez, female    DOB: [birth year], 60 y.o.   MRN: [id number]  HPI  Patient presents the clinic today to follow-up A1c.  She reports she had her A1c drawn by her GYN.  It came back at 6.5%.  She has never been diagnosed with diabetes but does have a family history of this.  Her last A1c was 6%, 03/2015. She denies visual changes, increased thirst, urinary frequency, numbness or tingling in her hands or feet or non healing wounds.  Review of Systems      Past Medical History:  Diagnosis Date  . Anxiety   . CAD (coronary artery disease)    a. 04/2015 NSTEMI/PCI: LM nl, LAD 30d, D1 100 (2.25x12 Promus Premier DES), D2 small, RI small, LCX nl, OM1/2 nl, RCA/PDA nl, EF 45-50%.  . Hyperlipidemia LDL goal <70 05/15/2015  . Osteoporosis       Allergies  Allergen Reactions  . Erythromycin Nausea And Vomiting  . Floxin [Ofloxacin] Other (See Comments)    Unable to close eyes  . Macrobid [Nitrofurantoin Macrocrystal] Nausea And Vomiting    Family History  Problem Relation Age of Onset  . Colon cancer Cousin 78  . Breast cancer Cousin 12  . Heart disease Mother   . Heart disease Father   . COPD Father   . Heart attack Maternal Grandmother   . Heart attack Paternal Grandmother   . Breast cancer Maternal Aunt 65  . Diabetes Maternal Aunt   . Heart disease Paternal Aunt     Social History   Socioeconomic History  . Marital status: Married    Spouse name: Not on file  . Number of children: Not on file  . Years of education: Not on file  . Highest education level: Not on file  Occupational History  . Not on file  Tobacco Use  . Smoking status: Never Smoker  . Smokeless tobacco: Never Used  Substance and Sexual Activity  . Alcohol use: No  . Drug use: No  . Sexual activity: Yes    Birth control/protection: Post-menopausal  Other Topics Concern  . Not on file  Social History Narrative  . Not on file   Social Determinants of Health    Financial Resource Strain: Not on file  Food Insecurity: Not on file  Transportation Needs: Not on file  Physical Activity: Not on file  Stress: Not on file  Social Connections: Not on file  Intimate Partner Violence: Not on file     Constitutional: Denies fever, malaise, fatigue, headache or abrupt weight changes.  HEENT: Denies eye pain, eye redness, ear pain, ringing in the ears, wax buildup, runny nose, nasal congestion, bloody nose, or sore throat. Respiratory: Denies difficulty breathing, shortness of breath, cough or sputum production.   Cardiovascular: Denies chest pain, chest tightness, palpitations or swelling in the hands or feet.  Gastrointestinal: Denies abdominal pain, bloating, constipation, diarrhea or blood in the stool.  GU: Denies urgency, frequency, pain with urination, burning sensation, blood in urine, odor or discharge. Skin: Denies redness, rashes, lesions or ulcercations.  Neurological: Denies dizziness, difficulty with memory, difficulty with speech or problems with balance and coordination.    No other specific complaints in a complete review of systems (except as listed in HPI above).  Objective:   Physical Exam  BP 124/80   Pulse 68   Temp 97.6 F (36.4 C) (Temporal)   Wt 160 lb (72.6 kg)   SpO2 98%  BMI 27.46 kg/m   Wt Readings from Last 3 Encounters:  10/27/19 170 lb (77.1 kg)  02/06/19 154 lb (69.9 kg)  02/20/18 163 lb (73.9 kg)    General: Appears her stated age, well developed, well nourished in NAD. Skin: Warm, dry and intact. No ulcerations noted. HEENT: Head: normal shape and size; Eyes: sclera white, no icterus, EOMs intact;  Cardiovascular: Normal rate. Pulmonary/Chest: Normal effort. Neurological: Alert and oriented.    BMET    Component Value Date/Time   NA 140 05/15/2015 0259   K 4.7 05/15/2015 0259   CL 109 05/15/2015 0259   CO2 24 05/15/2015 0259   GLUCOSE 119 (H) 05/15/2015 0259   BUN 14 05/15/2015 0259    CREATININE 0.80 05/15/2015 0259   CALCIUM 8.9 05/15/2015 0259   GFRNONAA >60 05/15/2015 0259   GFRAA >60 05/15/2015 0259    Lipid Panel     Component Value Date/Time   CHOL 127 01/28/2020 0947   TRIG 127 01/28/2020 0947   HDL 58 01/28/2020 0947   CHOLHDL 2.2 01/28/2020 0947   CHOLHDL 1.5 07/07/2015 0813   VLDL 16 07/07/2015 0813   LDLCALC 47 01/28/2020 0947    CBC    Component Value Date/Time   WBC 6.5 05/15/2015 0259   RBC 4.38 05/15/2015 0259   HGB 13.5 05/15/2015 0259   HCT 41.8 05/15/2015 0259   PLT 224 05/15/2015 0259   MCV 95.4 05/15/2015 0259   MCH 30.8 05/15/2015 0259   MCHC 32.3 05/15/2015 0259   RDW 14.0 05/15/2015 0259   LYMPHSABS 2.4 05/14/2015 0221   MONOABS 0.7 05/14/2015 0221   EOSABS 0.1 05/14/2015 0221   BASOSABS 0.0 05/14/2015 0221    Hgb A1C Lab Results  Component Value Date   HGBA1C 6.0 (H) 05/14/2015            Assessment & Plan:    Webb Silversmith, NP This visit occurred during the SARS-CoV-2 public health emergency.  Safety protocols were in place, including screening questions prior to the visit, additional usage of staff PPE, and extensive cleaning of exam room while observing appropriate contact time as indicated for disinfecting solutions.

## 2020-12-07 NOTE — Assessment & Plan Note (Signed)
New onset Discussed diabetes and standards of medical care Handout given on diabetic diet and glycemic index Encouraged low carb diet and exercise for weight loss Encouraged routine dilated eye exams Encouraged routine foot exams Flu shot UTD Pneuomovax today Encouraged her to get her Covid booster  RTC in 3 months for A1C, urine microalbumin and lipid profile

## 2020-12-31 ENCOUNTER — Telehealth: Payer: Self-pay | Admitting: Internal Medicine

## 2020-12-31 DIAGNOSIS — E119 Type 2 diabetes mellitus without complications: Secondary | ICD-10-CM

## 2020-12-31 NOTE — Addendum Note (Signed)
Addended by: Jearld Fenton on: 12/31/2020 03:42 PM   Modules accepted: Orders

## 2020-12-31 NOTE — Telephone Encounter (Signed)
Patient called in stating that on her last visit she was diagnosed with diabetes and Rollene Fare had mentioned her to see a nutritionist. Patient is asking for a recommendation. Please advise patient Brenda Chavez

## 2020-12-31 NOTE — Telephone Encounter (Signed)
Referral to diabetes education and nutrition placed

## 2021-01-02 ENCOUNTER — Other Ambulatory Visit: Payer: Self-pay | Admitting: Cardiovascular Disease

## 2021-01-06 ENCOUNTER — Ambulatory Visit
Admission: RE | Admit: 2021-01-06 | Discharge: 2021-01-06 | Disposition: A | Discharge status: Home / Self Care | Payer: BC Managed Care – PPO | Source: Ambulatory Visit | Attending: Obstetrics and Gynecology | Admitting: Obstetrics and Gynecology

## 2021-01-06 ENCOUNTER — Other Ambulatory Visit: Payer: Self-pay

## 2021-01-06 DIAGNOSIS — Z1231 Encounter for screening mammogram for malignant neoplasm of breast: Secondary | ICD-10-CM

## 2021-01-18 ENCOUNTER — Telehealth: Payer: Self-pay | Admitting: *Deleted

## 2021-01-18 NOTE — Telephone Encounter (Signed)
Spoke with the patient and scheduled a new patient appt for 5/3 at 11:15 am with Dr Berline Lopes; patient given an arrival time of 10:45 am. Patient given the address and phone number for the clinic. Patient also given the policy for mask and visitors.

## 2021-01-21 ENCOUNTER — Encounter: Payer: Self-pay | Admitting: Gynecologic Oncology

## 2021-01-25 ENCOUNTER — Other Ambulatory Visit: Payer: Self-pay

## 2021-01-25 ENCOUNTER — Inpatient Hospital Stay: Payer: 59 | Attending: Gynecologic Oncology | Admitting: Gynecologic Oncology

## 2021-01-25 ENCOUNTER — Encounter: Payer: Self-pay | Admitting: Gynecologic Oncology

## 2021-01-25 ENCOUNTER — Other Ambulatory Visit (HOSPITAL_COMMUNITY): Payer: Self-pay | Admitting: Gynecologic Oncology

## 2021-01-25 ENCOUNTER — Other Ambulatory Visit: Payer: Self-pay | Admitting: Gynecologic Oncology

## 2021-01-25 VITALS — BP 137/89 | HR 56 | Temp 97.5°F | Resp 16 | Ht 64.0 in | Wt 153.0 lb

## 2021-01-25 DIAGNOSIS — E785 Hyperlipidemia, unspecified: Secondary | ICD-10-CM | POA: Diagnosis not present

## 2021-01-25 DIAGNOSIS — F419 Anxiety disorder, unspecified: Secondary | ICD-10-CM | POA: Diagnosis not present

## 2021-01-25 DIAGNOSIS — Z79899 Other long term (current) drug therapy: Secondary | ICD-10-CM | POA: Diagnosis not present

## 2021-01-25 DIAGNOSIS — I2510 Atherosclerotic heart disease of native coronary artery without angina pectoris: Secondary | ICD-10-CM | POA: Diagnosis not present

## 2021-01-25 DIAGNOSIS — M810 Age-related osteoporosis without current pathological fracture: Secondary | ICD-10-CM | POA: Diagnosis not present

## 2021-01-25 DIAGNOSIS — E119 Type 2 diabetes mellitus without complications: Secondary | ICD-10-CM | POA: Insufficient documentation

## 2021-01-25 DIAGNOSIS — Z7982 Long term (current) use of aspirin: Secondary | ICD-10-CM | POA: Diagnosis not present

## 2021-01-25 DIAGNOSIS — R87619 Unspecified abnormal cytological findings in specimens from cervix uteri: Secondary | ICD-10-CM

## 2021-01-25 DIAGNOSIS — I252 Old myocardial infarction: Secondary | ICD-10-CM | POA: Insufficient documentation

## 2021-01-25 DIAGNOSIS — R87629 Unspecified abnormal cytological findings in specimens from vagina: Secondary | ICD-10-CM

## 2021-01-25 MED ORDER — TRAMADOL HCL 50 MG PO TABS: 50.0000 mg | ORAL_TABLET | Freq: Four times a day (QID) | ORAL | 0 refills | Status: DC | PRN

## 2021-01-25 MED ORDER — IBUPROFEN 600 MG PO TABS: 600.0000 mg | ORAL_TABLET | Freq: Three times a day (TID) | ORAL | 0 refills | Status: DC | PRN

## 2021-01-25 MED ORDER — SENNOSIDES-DOCUSATE SODIUM 8.6-50 MG PO TABS: 2.0000 | ORAL_TABLET | Freq: Every day | ORAL | 0 refills | Status: DC

## 2021-01-25 NOTE — Progress Notes (Signed)
GYNECOLOGIC ONCOLOGY NEW PATIENT CONSULTATION   Patient Name: Brenda Chavez  Patient Age: 60 y.o. Date of Service: 01/25/21 Referring Provider: [name], MD  Primary Care Provider: Jearld Fenton, NP Consulting Provider: Jeral Pinch, MD   Assessment/Plan:  Postmenopausal patient with atypical glandular and squamous cells seen on cervical and endocervical sampling in the last year without evidence of HR HPV infection.  Reviewed in detail with the patient her pap/colposcopy/EMB history over the last 18 months. Prior to 2021, she had a remote history of what sounds like low-grade cervical dysplasia (required no treatment) followed by a long period of normal pap tests. There is descrepency between her pap tests and colposcopy results over the last year, with some showing atypical glandular cells (favoring endocervical or uterine origin) and some showing atypical squamous cells. Additionally, her coloscopic biopsies in 2021 showed koilocytic changes despite her pap test from that time being negative for HR HPV.  She is asymptomatic, but given abnormalities on pap and biopsies, I recommend additional work-up. I think that she warrants both further assessment of her cervix, endocervix, and endometrium. She has been noted to have cervical stenosis that has made EMB and ECC challenging. I recommend that we plan to proceed with an outpatient procedure to include EUA, colposcopy, possible cervical biopsies, possible cold knife cone, ECC, possible hysteroscopy, endometrial sampling using the Myosure device, possible D&C under ultrasound guidance, and any other indicated procedures.   The risks of surgery were discussed in detail and she understands these to include infection; injury to adjacent organs; bleeding which may require blood transfusion; anesthesia risk; thromboembolic events; possible death; unforeseen complications; possible need for re-exploration; medical complications such as heart  attack, stroke, pleural effusion and pneumonia. The patient will receive DVT and antibiotic prophylaxis as indicated. She voiced a clear understanding. She had the opportunity to ask questions. Perioperative instructions were reviewed with her. Prescriptions for post-op medications were sent to her pharmacy of choice.  A copy of this note was sent to the patient's referring provider.   60 minutes of total time was spent for this patient encounter, including preparation, face-to-face counseling with the patient and coordination of care, and documentation of the encounter.  Jeral Pinch, MD  Division of Gynecologic Oncology  Department of Obstetrics and Gynecology  [facility]  ___________________________________________  Chief Complaint: Chief Complaint  Patient presents with  . Atypical endocervical cells on cervical Papanicolaou smear    History of Present Illness:  Brenda Chavez is a 60 y.o. y.o. female who is seen in consultation at the request of Dr. Julien Girt for an evaluation of atypical endocervical glandular cells with difficulty sampling endometrium and endocervix.  Patient reports regular Pap smears until 2021.  His history is outlined below.  At that time she had atypical glandular cells seen on a Pap test.  She underwent colposcopy with atypia consistent with HPV effect was noted on cervical biopsies.  Endometrial biopsy at that time showed an endometrioid type polyp without hyperplasia or malignancy.  Repeat Pap test earlier this year showed atypical endocervical glandular cells.  HPV testing was negative in 2011 as well as this year.  Most recent cervical biopsies in April showed benign squamous mucosa.  ECC was attempted with some atypical squamous cells noted without definitive dysplasia and.  Recent Pap test and colposcopy history: Colposcopy performed on 01/10/2021 with a cervical biopsy showing benign squamous mucosa, no endocervical mucosa  present.  Endocervical curettage showing atypical squamous cells, most  of which are described as degenerating squamous cells, few of which are atypical.  No definitive dysplasia seen. Ultrasound 01/10/2021 showed a uterus measuring 5.8 x 1.6 x 3 cm with an endometrial lining of 6.2 mm.,  Simple cystic areas seen within the endometrial lining.  Ovaries normal in appearance and size.  No free fluid. Pap test performed 11/18/2020 showed atypical endocervical glandular cells, mostly inactive degenerating squamous, bacterial morphology consistent with actinomyces species.  High risk HPV not detected. Endometrial biopsy 11/20/2019 showed an endometrioid type polyp, no malignancy or hyperplasia. Colposcopy was performed in January 2021 with cervical biopsy showing squamous atypia consistent with koilocytic effect.  No glandular mucosa identified.  No malignancy seen. Pap test in January 2021 showed atypical glandular cells.  High risk HPV not detected.  Overall she reports doing well.  She denies any vaginal bleeding, pain, or cramping.  She has had no postmenopausal bleeding since menopause at the age of 62.  She endorses a good appetite without any nausea or emesis.  She reports regular bowel and bladder function.  Past medical history is notable for a myocardial infarction in 2016 requiring intervention.  She follows yearly with cardiology now.  She is on aspirin only as well as a statin and beta-blocker.  She denies any shortness of breath or chest pain at rest or with ambulation.  Was recently diagnosed with type 2 diabetes mellitus.  The patient lives in [place] with her husband.  She is not currently working.  She denies any tobacco use or history of use.  PAST MEDICAL HISTORY:  Past Medical History:  Diagnosis Date  . Anxiety   . CAD (coronary artery disease)    a. 04/2015 NSTEMI/PCI: LM nl, LAD 30d, D1 100 (2.25x12 Promus Premier DES), D2 small, RI small, LCX nl, OM1/2 nl, RCA/PDA nl, EF 45-50%.   . Diabetes mellitus without complication ([place]) 17/5102  . Glaucoma   . Heart attack ([place]) 04/2015  . Hyperlipidemia LDL goal <70 05/15/2015  . Osteoporosis      PAST SURGICAL HISTORY:  Past Surgical History:  Procedure Laterality Date  . CARDIAC CATHETERIZATION N/A 05/14/2015   Procedure: Left Heart Cath and Coronary Angiography;  Surgeon: Burnell Blanks, MD;  Location: [place] [facility];  Service: Cardiovascular;  Laterality: N/A;  . CARDIAC CATHETERIZATION N/A 05/14/2015   Procedure: Coronary Stent Intervention;  Surgeon: Burnell Blanks, MD;  Location: [place] [facility];  Service: Cardiovascular;  Laterality: N/A;  . WISDOM TOOTH EXTRACTION  1984    OB/GYN HISTORY:  OB History  Gravida Para Term Preterm AB Living  0 0 0 0 0 0  SAB IAB Ectopic Multiple Live Births  0 0 0 0 0    No LMP recorded. Patient is postmenopausal.  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 acetaminophen (TYLENOL) 325 MG tablet Take 2 tablets (650 mg total) by mouth every 4 (four) hours as needed for headache or mild pain.    . Ascorbic Acid (VITAMIN C) 1000 MG tablet Take 1,000 mg by mouth daily.    Marland Kitchen  aspirin EC 81 MG tablet Take 1 tablet (81 mg total) by mouth daily. 90 tablet 3  . atorvastatin (LIPITOR) 40 MG tablet Take 1 tablet (40 mg total) by mouth daily. Please keep upcoming appt for further refills 90 tablet 0  . cholecalciferol (VITAMIN D) 1000 UNITS tablet Take 1,000 Units by mouth daily.    Marland Kitchen CINNAMON PO Take 1 capsule by  mouth 2 (two) times daily.    . citalopram (CELEXA) 40 MG tablet TAKE 1 TABLET BY MOUTH DAILY. MUST SCHEDULE ANNUAL EXAM FOR MORE REFILLS 30 tablet 1  . esomeprazole (NEXIUM) 20 MG capsule Take 20 mg by mouth daily.    . Ginkgo Biloba (GINKOBA PO) Take 500 mg by mouth 2 (two) times daily.    Marland Kitchen latanoprost (XALATAN) 0.005 % ophthalmic solution 1 drop at bedtime.    . metoprolol tartrate (LOPRESSOR) 25 MG tablet Take 0.5 tablets (12.5 mg total) by mouth 2 (two) times daily. Please make yearly appt with Dr. Angelena Form for February for future refills. Thank you 1st attempt 90 tablet 0  . MAGNESIUM CITRATE PO Take 250 mg by mouth daily.    . nitroGLYCERIN (NITROSTAT) 0.4 MG SL tablet Place 1 tablet (0.4 mg total) under the tongue every 5 (five) minutes x 3 doses as needed for chest pain. (Patient not taking: Reported on 01/21/2021) 25 tablet 6   No current facility-administered medications on file prior to visit.   ALLERGIES:  Allergies  Allergen Reactions  . Erythromycin Nausea And Vomiting  . Floxin [Ofloxacin] Other (See Comments)    Unable to close eyes  . Macrobid [Nitrofurantoin Macrocrystal] Nausea And Vomiting     FAMILY HISTORY:  Family History  Problem Relation Age of Onset  . Breast cancer Cousin 21  . Heart disease Mother   . Heart disease Father   . COPD Father   . Heart attack Maternal Grandmother   . Heart attack Paternal Grandmother   . Breast cancer Maternal Aunt 63  . Diabetes Maternal Aunt   . Heart disease Paternal Aunt   . Colon cancer Cousin   . Breast cancer Cousin   . Ovarian cancer Neg Hx   . Pancreatic cancer Neg Hx   . Endometrial cancer Neg Hx      SOCIAL HISTORY:  Social Connections: Not on file    REVIEW OF SYSTEMS:  Denies appetite changes, fevers, chills, fatigue, unexplained weight changes. Denies hearing loss, neck lumps or masses, mouth sores, ringing in ears or voice changes. Denies cough or wheezing.  Denies shortness of breath. Denies chest  pain or palpitations. Denies leg swelling. Denies abdominal distention, pain, blood in stools, constipation, diarrhea, nausea, vomiting, or early satiety. Denies pain with intercourse, dysuria, frequency, hematuria or incontinence. Denies hot flashes, pelvic pain, vaginal bleeding or vaginal discharge.   Denies joint pain, back pain or muscle pain/cramps. Denies itching, rash, or wounds. Denies dizziness, headaches, numbness or seizures. Denies swollen lymph nodes or glands, denies easy bruising or bleeding. Denies anxiety, depression, confusion, or decreased concentration.  Physical Exam:  Vital Signs for this encounter:  Blood pressure 137/89, pulse (!) 56, temperature (!) 97.5 F (36.4 C), temperature source Tympanic, resp. rate 16, height 5\' 4"  (1.626 m), weight 153 lb (69.4 kg), SpO2 98 %. Body mass index is 26.26 kg/m. General: Alert, oriented, no acute distress.  HEENT: Normocephalic, atraumatic. Sclera anicteric.  Chest: Clear to auscultation bilaterally. No wheezes, rhonchi, or rales. Cardiovascular: Regular rate and rhythm, no murmurs, rubs, or gallops.  Extremities: Grossly normal range of motion. Warm, well perfused. No edema bilaterally.  Skin: No rashes or lesions.   LABORATORY AND RADIOLOGIC DATA:  Outside medical records were reviewed to synthesize the above history, along with the history and physical obtained during the visit.   Lab Results  Component Value Date   WBC 6.5 05/15/2015   HGB 13.5 05/15/2015   HCT 41.8 05/15/2015   PLT 224 05/15/2015   GLUCOSE 119 (H) 05/15/2015   CHOL 127 01/28/2020   TRIG 127 01/28/2020   HDL 58 01/28/2020   LDLCALC 47 01/28/2020   ALT 35 (H) 01/28/2020   AST 25 01/28/2020   NA 140 05/15/2015   K 4.7 05/15/2015   CL 109 05/15/2015   CREATININE 0.80 05/15/2015   BUN 14 05/15/2015   CO2 24 05/15/2015   TSH 2.251 05/14/2015   INR 0.86 05/14/2015   HGBA1C 6.0 (H) 05/14/2015

## 2021-01-25 NOTE — Patient Instructions (Addendum)
Preparing for your Surgery  Plan for surgery on Feb 09, 2021 with Dr. Jeral Pinch at [facility]. You will be scheduled for a colposcopy (looking at the cervix with a microscope), cervical biopsies, possible cold knife conization biopsy of the cervix, endocervical curettage (sample from the inner canal of the cervix), dilation and curettage of the uterus with Myosure, hysteroscopy, possible transabdominal ultrasound.   Pre-operative Testing -You will receive a phone call from presurgical testing at [facility] to discuss preoperative instructions, labs, and COVID test. The COVID test normally happens 3 days prior to the surgery and they ask that you self quarantine after the test up until surgery to decrease chance of exposure.  -Bring your insurance card, copy of an advanced directive if applicable, medication list.  -You are fine to keep taking your baby aspirin (81mg ) up until the day before surgery.  -Do not take supplements such as fish oil (omega 3), red yeast rice, turmeric before your surgery. You want to avoid medications with aspirin in them including headache powders such as BC or Goody's), Excedrin migraine.  Day Before Surgery at [facility] will be advised you can have clear liquids up until 3 hours before your surgery.    Your role in recovery Your role is to become active as soon as directed by your doctor, while still giving yourself time to heal.  Rest when you feel tired. You will be asked to do the following in order to speed your recovery:  - Cough and breathe deeply. This helps to clear and expand your lungs and can prevent pneumonia after surgery.  - [place]. Do mild physical activity. Walking or moving your legs help your circulation and body functions return to normal. Do not try to get up or walk alone the first time after surgery.   -If you develop swelling on one leg or the other, pain in the back of  your leg, redness/warmth in one of your legs, please call the office or go to the Emergency Room to have a doppler to rule out a blood clot. For shortness of breath, chest pain-seek care in the Emergency Room as soon as possible. - Actively manage your pain. Managing your pain lets you move in comfort. We will ask you to rate your pain on a scale of zero to 10. It is your responsibility to tell your doctor or nurse where and how much you hurt so your pain can be treated.  Special Considerations -Your final pathology results from surgery should be available around one week after surgery and the results will be relayed to you when available.  -FMLA forms can be faxed to [phone] and please allow 5-7 business days for completion.  Pain Management After Surgery -You have been prescribed your pain medication and bowel regimen medications before surgery so that you can have these available when you are discharged from the hospital. The pain medication is for use ONLY AFTER surgery and a new prescription will not be given.   -Make sure that you have Tylenol and Ibuprofen at home to use on a regular basis after surgery for pain control. We recommend alternating the medications every hour to six hours since they work differently and are processed in the body differently for pain relief. We would recommend using the ibuprofen sparingly since it can increase your chance of GI bleeding while taking Celexa.  -Review the attached handout on narcotic use and their risks  and side effects.   Bowel Regimen -You have been prescribed Sennakot-S to take nightly to prevent constipation especially if you are taking the narcotic pain medication intermittently.  It is important to prevent constipation and drink adequate amounts of liquids. You can stop taking this medication when you are not taking pain medication and you are back on your normal bowel routine.  Risks of Surgery Risks of surgery are low but include  bleeding, infection, damage to surrounding structures, re-operation, blood clots, and very rarely death.  AFTER SURGERY INSTRUCTIONS  Return to work: 2-4 weeks if applicable  Activity: 1. Be up and out of the bed during the day.  Take a nap if needed.  You may walk up steps but be careful and use the hand rail.  Stair climbing will tire you more than you think, you may need to stop part way and rest.   2. No lifting or straining for 6 weeks over 10 pounds. No pushing, pulling, straining for 6 weeks.  3. No driving for minimum of 24 hours after the procedure.  Do not drive if you are taking narcotic pain medicine and make sure that your reaction time has returned.   4. You can shower as soon as the next day after surgery. Shower daily.  Use your regular soap and water (not directly on the incision) and pat your incision(s) dry afterwards; don't rub.  No tub baths or submerging your body in water until cleared by your surgeon.  5. No sexual activity and nothing in the vagina for 4 weeks.  6. You may experience vaginal spotting after surgery or around the 6-8 week mark from surgery when the stitches at the top of the vagina begin to dissolve.  The spotting is normal but if you experience heavy bleeding, call our office.  7. Take Tylenol or ibuprofen first for pain and only use narcotic pain medication for severe pain not relieved by the Tylenol or Ibuprofen.  Monitor your Tylenol intake to a max of 4,000 mg in a 24 hour period. You can alternate these medications after surgery.  Diet: 1. Low sodium Heart Healthy Diet is recommended but you are cleared to resume your normal (before surgery) diet after your procedure.  2. It is safe to use a laxative, such as Miralax or Colace, if you have difficulty moving your bowels. You have been prescribed Sennakot at bedtime every evening to keep bowel movements regular and to prevent constipation.    Wound Care: 1. Keep clean and dry.  Shower  daily.  Reasons to call the Doctor:  Fever - Oral temperature greater than 100.4 degrees Fahrenheit  Foul-smelling vaginal discharge  Difficulty urinating  Nausea and vomiting  Increased pain at the site of the incision that is unrelieved with pain medicine.  Difficulty breathing with or without chest pain  New calf pain especially if only on one side  Sudden, continuing increased vaginal bleeding with or without clots.   Contacts: For questions or concerns you should contact:  Dr. Jeral Pinch at [phone]  Joylene John, NP at [phone]  After Hours: call [phone] and have the GYN Oncologist paged/contacted (after 5 pm or on the weekends).  Messages sent via mychart are for non-urgent matters and are not responded to after hours so for urgent needs, please call the after hours number.

## 2021-01-25 NOTE — H&P (View-Only) (Signed)
GYNECOLOGIC ONCOLOGY NEW PATIENT CONSULTATION   Patient Name: Brenda Chavez  Patient Age: 60 y.o. Date of Service: 01/25/21 Referring Provider: [name], MD  Primary Care Provider: Jearld Fenton, NP Consulting Provider: Jeral Pinch, MD   Assessment/Plan:  Postmenopausal patient with atypical glandular and squamous cells seen on cervical and endocervical sampling in the last year without evidence of HR HPV infection.  Reviewed in detail with the patient her pap/colposcopy/EMB history over the last 18 months. Prior to 2021, she had a remote history of what sounds like low-grade cervical dysplasia (required no treatment) followed by a long period of normal pap tests. There is descrepency between her pap tests and colposcopy results over the last year, with some showing atypical glandular cells (favoring endocervical or uterine origin) and some showing atypical squamous cells. Additionally, her coloscopic biopsies in 2021 showed koilocytic changes despite her pap test from that time being negative for HR HPV.  She is asymptomatic, but given abnormalities on pap and biopsies, I recommend additional work-up. I think that she warrants both further assessment of her cervix, endocervix, and endometrium. She has been noted to have cervical stenosis that has made EMB and ECC challenging. I recommend that we plan to proceed with an outpatient procedure to include EUA, colposcopy, possible cervical biopsies, possible cold knife cone, ECC, possible hysteroscopy, endometrial sampling using the Myosure device, possible D&C under ultrasound guidance, and any other indicated procedures.   The risks of surgery were discussed in detail and she understands these to include infection; injury to adjacent organs; bleeding which may require blood transfusion; anesthesia risk; thromboembolic events; possible death; unforeseen complications; possible need for re-exploration; medical complications such as heart  attack, stroke, pleural effusion and pneumonia. The patient will receive DVT and antibiotic prophylaxis as indicated. She voiced a clear understanding. She had the opportunity to ask questions. Perioperative instructions were reviewed with her. Prescriptions for post-op medications were sent to her pharmacy of choice.  A copy of this note was sent to the patient's referring provider.   60 minutes of total time was spent for this patient encounter, including preparation, face-to-face counseling with the patient and coordination of care, and documentation of the encounter.  Jeral Pinch, MD  Division of Gynecologic Oncology  Department of Obstetrics and Gynecology  [facility]  ___________________________________________  Chief Complaint: Chief Complaint  Patient presents with  . Atypical endocervical cells on cervical Papanicolaou smear    History of Present Illness:  Brenda Chavez is a 60 y.o. y.o. female who is seen in consultation at the request of Dr. Julien Girt for an evaluation of atypical endocervical glandular cells with difficulty sampling endometrium and endocervix.  Patient reports regular Pap smears until 2021.  His history is outlined below.  At that time she had atypical glandular cells seen on a Pap test.  She underwent colposcopy with atypia consistent with HPV effect was noted on cervical biopsies.  Endometrial biopsy at that time showed an endometrioid type polyp without hyperplasia or malignancy.  Repeat Pap test earlier this year showed atypical endocervical glandular cells.  HPV testing was negative in 2011 as well as this year.  Most recent cervical biopsies in April showed benign squamous mucosa.  ECC was attempted with some atypical squamous cells noted without definitive dysplasia and.  Recent Pap test and colposcopy history: Colposcopy performed on 01/10/2021 with a cervical biopsy showing benign squamous mucosa, no endocervical mucosa  present.  Endocervical curettage showing atypical squamous cells, most  of which are described as degenerating squamous cells, few of which are atypical.  No definitive dysplasia seen. Ultrasound 01/10/2021 showed a uterus measuring 5.8 x 1.6 x 3 cm with an endometrial lining of 6.2 mm.,  Simple cystic areas seen within the endometrial lining.  Ovaries normal in appearance and size.  No free fluid. Pap test performed 11/18/2020 showed atypical endocervical glandular cells, mostly inactive degenerating squamous, bacterial morphology consistent with actinomyces species.  High risk HPV not detected. Endometrial biopsy 11/20/2019 showed an endometrioid type polyp, no malignancy or hyperplasia. Colposcopy was performed in January 2021 with cervical biopsy showing squamous atypia consistent with koilocytic effect.  No glandular mucosa identified.  No malignancy seen. Pap test in January 2021 showed atypical glandular cells.  High risk HPV not detected.  Overall she reports doing well.  She denies any vaginal bleeding, pain, or cramping.  She has had no postmenopausal bleeding since menopause at the age of 62.  She endorses a good appetite without any nausea or emesis.  She reports regular bowel and bladder function.  Past medical history is notable for a myocardial infarction in 2016 requiring intervention.  She follows yearly with cardiology now.  She is on aspirin only as well as a statin and beta-blocker.  She denies any shortness of breath or chest pain at rest or with ambulation.  Was recently diagnosed with type 2 diabetes mellitus.  The patient lives in [place] with her husband.  She is not currently working.  She denies any tobacco use or history of use.  PAST MEDICAL HISTORY:  Past Medical History:  Diagnosis Date  . Anxiety   . CAD (coronary artery disease)    a. 04/2015 NSTEMI/PCI: LM nl, LAD 30d, D1 100 (2.25x12 Promus Premier DES), D2 small, RI small, LCX nl, OM1/2 nl, RCA/PDA nl, EF 45-50%.   . Diabetes mellitus without complication ([place]) 17/5102  . Glaucoma   . Heart attack ([place]) 04/2015  . Hyperlipidemia LDL goal <70 05/15/2015  . Osteoporosis      PAST SURGICAL HISTORY:  Past Surgical History:  Procedure Laterality Date  . CARDIAC CATHETERIZATION N/A 05/14/2015   Procedure: Left Heart Cath and Coronary Angiography;  Surgeon: Burnell Blanks, MD;  Location: [place] [facility];  Service: Cardiovascular;  Laterality: N/A;  . CARDIAC CATHETERIZATION N/A 05/14/2015   Procedure: Coronary Stent Intervention;  Surgeon: Burnell Blanks, MD;  Location: [place] [facility];  Service: Cardiovascular;  Laterality: N/A;  . WISDOM TOOTH EXTRACTION  1984    OB/GYN HISTORY:  OB History  Gravida Para Term Preterm AB Living  0 0 0 0 0 0  SAB IAB Ectopic Multiple Live Births  0 0 0 0 0    No LMP recorded. Patient is postmenopausal.  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 acetaminophen (TYLENOL) 325 MG tablet Take 2 tablets (650 mg total) by mouth every 4 (four) hours as needed for headache or mild pain.    . Ascorbic Acid (VITAMIN C) 1000 MG tablet Take 1,000 mg by mouth daily.    Marland Kitchen  aspirin EC 81 MG tablet Take 1 tablet (81 mg total) by mouth daily. 90 tablet 3  . atorvastatin (LIPITOR) 40 MG tablet Take 1 tablet (40 mg total) by mouth daily. Please keep upcoming appt for further refills 90 tablet 0  . cholecalciferol (VITAMIN D) 1000 UNITS tablet Take 1,000 Units by mouth daily.    Marland Kitchen CINNAMON PO Take 1 capsule by  mouth 2 (two) times daily.    . citalopram (CELEXA) 40 MG tablet TAKE 1 TABLET BY MOUTH DAILY. MUST SCHEDULE ANNUAL EXAM FOR MORE REFILLS 30 tablet 1  . esomeprazole (NEXIUM) 20 MG capsule Take 20 mg by mouth daily.    . Ginkgo Biloba (GINKOBA PO) Take 500 mg by mouth 2 (two) times daily.    Marland Kitchen latanoprost (XALATAN) 0.005 % ophthalmic solution 1 drop at bedtime.    . metoprolol tartrate (LOPRESSOR) 25 MG tablet Take 0.5 tablets (12.5 mg total) by mouth 2 (two) times daily. Please make yearly appt with Dr. Angelena Form for February for future refills. Thank you 1st attempt 90 tablet 0  . MAGNESIUM CITRATE PO Take 250 mg by mouth daily.    . nitroGLYCERIN (NITROSTAT) 0.4 MG SL tablet Place 1 tablet (0.4 mg total) under the tongue every 5 (five) minutes x 3 doses as needed for chest pain. (Patient not taking: Reported on 01/21/2021) 25 tablet 6   No current facility-administered medications on file prior to visit.   ALLERGIES:  Allergies  Allergen Reactions  . Erythromycin Nausea And Vomiting  . Floxin [Ofloxacin] Other (See Comments)    Unable to close eyes  . Macrobid [Nitrofurantoin Macrocrystal] Nausea And Vomiting     FAMILY HISTORY:  Family History  Problem Relation Age of Onset  . Breast cancer Cousin 21  . Heart disease Mother   . Heart disease Father   . COPD Father   . Heart attack Maternal Grandmother   . Heart attack Paternal Grandmother   . Breast cancer Maternal Aunt 63  . Diabetes Maternal Aunt   . Heart disease Paternal Aunt   . Colon cancer Cousin   . Breast cancer Cousin   . Ovarian cancer Neg Hx   . Pancreatic cancer Neg Hx   . Endometrial cancer Neg Hx      SOCIAL HISTORY:  Social Connections: Not on file    REVIEW OF SYSTEMS:  Denies appetite changes, fevers, chills, fatigue, unexplained weight changes. Denies hearing loss, neck lumps or masses, mouth sores, ringing in ears or voice changes. Denies cough or wheezing.  Denies shortness of breath. Denies chest  pain or palpitations. Denies leg swelling. Denies abdominal distention, pain, blood in stools, constipation, diarrhea, nausea, vomiting, or early satiety. Denies pain with intercourse, dysuria, frequency, hematuria or incontinence. Denies hot flashes, pelvic pain, vaginal bleeding or vaginal discharge.   Denies joint pain, back pain or muscle pain/cramps. Denies itching, rash, or wounds. Denies dizziness, headaches, numbness or seizures. Denies swollen lymph nodes or glands, denies easy bruising or bleeding. Denies anxiety, depression, confusion, or decreased concentration.  Physical Exam:  Vital Signs for this encounter:  Blood pressure 137/89, pulse (!) 56, temperature (!) 97.5 F (36.4 C), temperature source Tympanic, resp. rate 16, height 5\' 4"  (1.626 m), weight 153 lb (69.4 kg), SpO2 98 %. Body mass index is 26.26 kg/m. General: Alert, oriented, no acute distress.  HEENT: Normocephalic, atraumatic. Sclera anicteric.  Chest: Clear to auscultation bilaterally. No wheezes, rhonchi, or rales. Cardiovascular: Regular rate and rhythm, no murmurs, rubs, or gallops.  Extremities: Grossly normal range of motion. Warm, well perfused. No edema bilaterally.  Skin: No rashes or lesions.   LABORATORY AND RADIOLOGIC DATA:  Outside medical records were reviewed to synthesize the above history, along with the history and physical obtained during the visit.   Lab Results  Component Value Date   WBC 6.5 05/15/2015   HGB 13.5 05/15/2015   HCT 41.8 05/15/2015   PLT 224 05/15/2015   GLUCOSE 119 (H) 05/15/2015   CHOL 127 01/28/2020   TRIG 127 01/28/2020   HDL 58 01/28/2020   LDLCALC 47 01/28/2020   ALT 35 (H) 01/28/2020   AST 25 01/28/2020   NA 140 05/15/2015   K 4.7 05/15/2015   CL 109 05/15/2015   CREATININE 0.80 05/15/2015   BUN 14 05/15/2015   CO2 24 05/15/2015   TSH 2.251 05/14/2015   INR 0.86 05/14/2015   HGBA1C 6.0 (H) 05/14/2015

## 2021-02-03 ENCOUNTER — Other Ambulatory Visit: Payer: Self-pay

## 2021-02-03 ENCOUNTER — Encounter (HOSPITAL_BASED_OUTPATIENT_CLINIC_OR_DEPARTMENT_OTHER): Payer: Self-pay | Admitting: Gynecologic Oncology

## 2021-02-03 NOTE — Progress Notes (Addendum)
ADDENDUM:  Chart reviewed by anesthesia, Dr Edwinna Areola MDA, stated ok to proceed.   Spoke w/ via phone for pre-op interview--- PT Lab needs dos---- no              Lab results------ pt getting CBC, BMP, and EKG done 02-07-2021 @ 0900 COVID test ------ 02-07-2021 @ 1025 Arrive at ------- 1115 on 02-09-2021 NPO after MN NO Solid Food.  Clear liquids from MN until--- 1015 Med rec completed Medications to take morning of surgery ----- Lopressor, Nexium Diabetic medication ----- n/a Patient instructed to bring photo id and insurance card day of surgery Patient aware to have Driver (ride ) / caregiver    for 24 hours after surgery -- husband, Gwyndolyn Saxon Patient Special Instructions ----- n/a Pre-Op special Istructions ----- n/a Patient verbalized understanding of instructions that were given at this phone interview. Patient denies shortness of breath, chest pain, fever, cough at this phone interview.   Anesthesia Review:  Hx NSTEMI 08/ 2016 post PCI w/ DES to D1;  DM2 diet controlled;  Pt denies any cardiac s&s, sob, and no peripheral swelling.  Pt stated last nitro taking 2019.  PCP: Webb Silversmith NP (lov 12-07-2020) Cardiologist : Dr Angelena Form (lov 10-27-2019,  Pt has year f/u scheduled 02-14-2021) Chest x-ray : 05-14-2015 EKG : 10-27-2019 Echo : 12-11-2019 Stress test: no Cardiac Cath :  05-14-2015 Activity level: denies sob w/ any actitivy Sleep Study/ CPAP : NO Fasting Blood Sugar :      / Checks Blood Sugar -- times a day:  NO Blood Thinner/ Instructions /Last Dose: NO ASA / Instructions/ Last Dose : ASA 81mg /  Pt stated given instructions to continue , does not need to stop prior to surgery

## 2021-02-07 ENCOUNTER — Other Ambulatory Visit: Payer: Self-pay

## 2021-02-07 ENCOUNTER — Telehealth: Payer: Self-pay

## 2021-02-07 ENCOUNTER — Other Ambulatory Visit (HOSPITAL_COMMUNITY)
Admission: RE | Admit: 2021-02-07 | Discharge: 2021-02-07 | Disposition: A | Discharge status: Home / Self Care | Payer: 59 | Source: Ambulatory Visit | Attending: Gynecologic Oncology | Admitting: Gynecologic Oncology

## 2021-02-07 ENCOUNTER — Encounter (HOSPITAL_COMMUNITY)
Admission: RE | Admit: 2021-02-07 | Discharge: 2021-02-07 | Disposition: A | Discharge status: Home / Self Care | Payer: 59 | Source: Ambulatory Visit | Attending: Gynecologic Oncology | Admitting: Gynecologic Oncology

## 2021-02-07 DIAGNOSIS — Z20822 Contact with and (suspected) exposure to covid-19: Secondary | ICD-10-CM | POA: Diagnosis not present

## 2021-02-07 DIAGNOSIS — Z01812 Encounter for preprocedural laboratory examination: Secondary | ICD-10-CM | POA: Insufficient documentation

## 2021-02-07 DIAGNOSIS — Z01818 Encounter for other preprocedural examination: Secondary | ICD-10-CM | POA: Insufficient documentation

## 2021-02-07 LAB — CBC
HCT: 45.7 % (ref 36.0–46.0)
Hemoglobin: 14.4 g/dL (ref 12.0–15.0)
MCH: 29.9 pg (ref 26.0–34.0)
MCHC: 31.5 g/dL (ref 30.0–36.0)
MCV: 95 fL (ref 80.0–100.0)
Platelets: 261 10*3/uL (ref 150–400)
RBC: 4.81 MIL/uL (ref 3.87–5.11)
RDW: 13.3 % (ref 11.5–15.5)
WBC: 6.1 10*3/uL (ref 4.0–10.5)
nRBC: 0 % (ref 0.0–0.2)

## 2021-02-07 LAB — BASIC METABOLIC PANEL
Anion gap: 5 (ref 5–15)
BUN: 23 mg/dL — ABNORMAL HIGH (ref 6–20)
CO2: 27 mmol/L (ref 22–32)
Calcium: 9.7 mg/dL (ref 8.9–10.3)
Chloride: 108 mmol/L (ref 98–111)
Creatinine, Ser: 0.85 mg/dL (ref 0.44–1.00)
GFR, Estimated: >60 mL/min (ref >60)
Glucose, Bld: 119 mg/dL — ABNORMAL HIGH (ref 70–99)
Potassium: 4.5 mmol/L (ref 3.5–5.1)
Sodium: 140 mmol/L (ref 135–145)

## 2021-02-07 NOTE — Telephone Encounter (Signed)
Brenda Chavez states that she understands her pre-op instructions and does not have any questions or concerns at this time.

## 2021-02-08 ENCOUNTER — Ambulatory Visit: Payer: 59

## 2021-02-08 LAB — SARS CORONAVIRUS 2 (TAT 6-24 HRS): SARS Coronavirus 2: NEGATIVE

## 2021-02-09 ENCOUNTER — Ambulatory Visit (HOSPITAL_BASED_OUTPATIENT_CLINIC_OR_DEPARTMENT_OTHER): Payer: 59 | Admitting: Certified Registered"

## 2021-02-09 ENCOUNTER — Encounter (HOSPITAL_BASED_OUTPATIENT_CLINIC_OR_DEPARTMENT_OTHER): Payer: Self-pay | Admitting: Gynecologic Oncology

## 2021-02-09 ENCOUNTER — Ambulatory Visit (HOSPITAL_BASED_OUTPATIENT_CLINIC_OR_DEPARTMENT_OTHER)
Admission: RE | Admit: 2021-02-09 | Discharge: 2021-02-09 | Disposition: A | Discharge status: Home / Self Care | Payer: 59 | Source: Ambulatory Visit | Attending: Gynecologic Oncology | Admitting: Gynecologic Oncology

## 2021-02-09 ENCOUNTER — Ambulatory Visit (HOSPITAL_COMMUNITY)
Admission: RE | Admit: 2021-02-09 | Discharge: 2021-02-09 | Disposition: A | Discharge status: Home / Self Care | Payer: 59 | Source: Ambulatory Visit | Attending: Gynecologic Oncology | Admitting: Gynecologic Oncology

## 2021-02-09 ENCOUNTER — Encounter (HOSPITAL_BASED_OUTPATIENT_CLINIC_OR_DEPARTMENT_OTHER)
Admission: RE | Disposition: A | Discharge status: Home / Self Care | Payer: Self-pay | Source: Ambulatory Visit | Attending: Gynecologic Oncology

## 2021-02-09 DIAGNOSIS — I252 Old myocardial infarction: Secondary | ICD-10-CM | POA: Diagnosis not present

## 2021-02-09 DIAGNOSIS — R87619 Unspecified abnormal cytological findings in specimens from cervix uteri: Secondary | ICD-10-CM | POA: Insufficient documentation

## 2021-02-09 DIAGNOSIS — E119 Type 2 diabetes mellitus without complications: Secondary | ICD-10-CM | POA: Insufficient documentation

## 2021-02-09 DIAGNOSIS — Z79899 Other long term (current) drug therapy: Secondary | ICD-10-CM | POA: Insufficient documentation

## 2021-02-09 DIAGNOSIS — Z955 Presence of coronary angioplasty implant and graft: Secondary | ICD-10-CM | POA: Diagnosis not present

## 2021-02-09 DIAGNOSIS — Z881 Allergy status to other antibiotic agents status: Secondary | ICD-10-CM | POA: Diagnosis not present

## 2021-02-09 DIAGNOSIS — N840 Polyp of corpus uteri: Secondary | ICD-10-CM | POA: Insufficient documentation

## 2021-02-09 DIAGNOSIS — R87629 Unspecified abnormal cytological findings in specimens from vagina: Secondary | ICD-10-CM

## 2021-02-09 DIAGNOSIS — Z7982 Long term (current) use of aspirin: Secondary | ICD-10-CM | POA: Insufficient documentation

## 2021-02-09 DIAGNOSIS — Z87410 Personal history of cervical dysplasia: Secondary | ICD-10-CM | POA: Diagnosis not present

## 2021-02-09 HISTORY — DX: Family history of other specified conditions: Z84.89

## 2021-02-09 HISTORY — DX: Type 2 diabetes mellitus without complications: E11.9

## 2021-02-09 HISTORY — DX: Unspecified abnormal cytological findings in specimens from cervix uteri: R87.619

## 2021-02-09 HISTORY — DX: Presence of spectacles and contact lenses: Z97.3

## 2021-02-09 HISTORY — DX: Unspecified glaucoma: H40.9

## 2021-02-09 HISTORY — DX: Gastro-esophageal reflux disease without esophagitis: K21.9

## 2021-02-09 HISTORY — PX: HYSTEROSCOPY WITH D & C: SHX1775

## 2021-02-09 HISTORY — PX: CERVICAL CONIZATION W/BX: SHX1330

## 2021-02-09 HISTORY — DX: Hyperlipidemia, unspecified: E78.5

## 2021-02-09 LAB — GLUCOSE, CAPILLARY: Glucose-Capillary: 109 mg/dL — ABNORMAL HIGH (ref 70–99)

## 2021-02-09 SURGERY — CONE BIOPSY, CERVIX
Anesthesia: General | Site: Vagina

## 2021-02-09 MED ORDER — DEXAMETHASONE SODIUM PHOSPHATE 10 MG/ML IJ SOLN
4.0000 mg | INTRAMUSCULAR | Status: DC
Start: 2021-02-09 — End: 2021-02-10

## 2021-02-09 MED ORDER — GLYCOPYRROLATE PF 0.2 MG/ML IJ SOSY
PREFILLED_SYRINGE | INTRAMUSCULAR | Status: AC
  Filled 2021-02-09: qty 1

## 2021-02-09 MED ORDER — ONDANSETRON HCL 4 MG/2ML IJ SOLN
INTRAMUSCULAR | Status: AC
  Filled 2021-02-09: qty 2

## 2021-02-09 MED ORDER — ONDANSETRON HCL 4 MG/2ML IJ SOLN
INTRAMUSCULAR | Status: DC | PRN
  Administered 2021-02-09: 4 mg via INTRAVENOUS

## 2021-02-09 MED ORDER — GABAPENTIN 300 MG PO CAPS
ORAL_CAPSULE | ORAL | Status: AC
  Filled 2021-02-09: qty 1

## 2021-02-09 MED ORDER — BUPIVACAINE HCL 0.25 % IJ SOLN
INTRAMUSCULAR | Status: DC | PRN
  Administered 2021-02-09: 10 mL

## 2021-02-09 MED ORDER — EPHEDRINE SULFATE-NACL 50-0.9 MG/10ML-% IV SOSY
PREFILLED_SYRINGE | INTRAVENOUS | Status: DC | PRN
  Administered 2021-02-09: 10 mg via INTRAVENOUS

## 2021-02-09 MED ORDER — IODINE STRONG (LUGOLS) 5 % PO SOLN
ORAL | Status: DC | PRN
  Administered 2021-02-09: 14 mL via ORAL

## 2021-02-09 MED ORDER — EPHEDRINE 5 MG/ML INJ
INTRAVENOUS | Status: AC
  Filled 2021-02-09: qty 10

## 2021-02-09 MED ORDER — FENTANYL CITRATE (PF) 100 MCG/2ML IJ SOLN
INTRAMUSCULAR | Status: AC
  Filled 2021-02-09: qty 2

## 2021-02-09 MED ORDER — ACETAMINOPHEN 500 MG PO TABS
ORAL_TABLET | ORAL | Status: AC
  Filled 2021-02-09: qty 2

## 2021-02-09 MED ORDER — SCOPOLAMINE 1 MG/3DAYS TD PT72
MEDICATED_PATCH | TRANSDERMAL | Status: AC
  Filled 2021-02-09: qty 1

## 2021-02-09 MED ORDER — CELECOXIB 200 MG PO CAPS
ORAL_CAPSULE | ORAL | Status: AC
  Filled 2021-02-09: qty 2

## 2021-02-09 MED ORDER — DEXAMETHASONE SODIUM PHOSPHATE 10 MG/ML IJ SOLN
INTRAMUSCULAR | Status: DC | PRN
  Administered 2021-02-09: 8 mg via INTRAVENOUS

## 2021-02-09 MED ORDER — DEXAMETHASONE SODIUM PHOSPHATE 10 MG/ML IJ SOLN
INTRAMUSCULAR | Status: AC
  Filled 2021-02-09: qty 1

## 2021-02-09 MED ORDER — GLYCOPYRROLATE 0.2 MG/ML IJ SOLN
INTRAMUSCULAR | Status: DC | PRN
  Administered 2021-02-09: .1 mg via INTRAVENOUS

## 2021-02-09 MED ORDER — LACTATED RINGERS IV SOLN: INTRAVENOUS | Status: DC

## 2021-02-09 MED ORDER — ACETAMINOPHEN 500 MG PO TABS
1000.0000 mg | ORAL_TABLET | ORAL | Status: AC
  Administered 2021-02-09: 1000 mg via ORAL

## 2021-02-09 MED ORDER — PROPOFOL 10 MG/ML IV BOLUS
INTRAVENOUS | Status: AC
  Filled 2021-02-09: qty 20

## 2021-02-09 MED ORDER — CELECOXIB 200 MG PO CAPS
400.0000 mg | ORAL_CAPSULE | ORAL | Status: AC
  Administered 2021-02-09: 400 mg via ORAL

## 2021-02-09 MED ORDER — OXYCODONE HCL 5 MG PO TABS: 5.0000 mg | ORAL_TABLET | Freq: Once | ORAL | Status: DC | PRN

## 2021-02-09 MED ORDER — OXYCODONE HCL 5 MG/5ML PO SOLN
5.0000 mg | Freq: Once | ORAL | Status: DC | PRN
Start: 2021-02-09 — End: 2021-02-10

## 2021-02-09 MED ORDER — MIDAZOLAM HCL 2 MG/2ML IJ SOLN
INTRAMUSCULAR | Status: AC
  Filled 2021-02-09: qty 2

## 2021-02-09 MED ORDER — FENTANYL CITRATE (PF) 100 MCG/2ML IJ SOLN: 25.0000 ug | INTRAMUSCULAR | Status: DC | PRN

## 2021-02-09 MED ORDER — ONDANSETRON HCL 4 MG/2ML IJ SOLN: 4.0000 mg | Freq: Once | INTRAMUSCULAR | Status: DC | PRN

## 2021-02-09 MED ORDER — MIDAZOLAM HCL 5 MG/5ML IJ SOLN
INTRAMUSCULAR | Status: DC | PRN
  Administered 2021-02-09: 2 mg via INTRAVENOUS

## 2021-02-09 MED ORDER — SCOPOLAMINE 1 MG/3DAYS TD PT72
1.0000 | MEDICATED_PATCH | TRANSDERMAL | Status: DC
  Administered 2021-02-09: 1.5 mg via TRANSDERMAL

## 2021-02-09 MED ORDER — LIDOCAINE 2% (20 MG/ML) 5 ML SYRINGE
INTRAMUSCULAR | Status: DC | PRN
  Administered 2021-02-09: 60 mg via INTRAVENOUS

## 2021-02-09 MED ORDER — PROPOFOL 10 MG/ML IV BOLUS
INTRAVENOUS | Status: DC | PRN
  Administered 2021-02-09: 30 mg via INTRAVENOUS
  Administered 2021-02-09: 100 mg via INTRAVENOUS

## 2021-02-09 MED ORDER — GABAPENTIN 300 MG PO CAPS
300.0000 mg | ORAL_CAPSULE | ORAL | Status: AC
  Administered 2021-02-09: 300 mg via ORAL

## 2021-02-09 MED ORDER — FENTANYL CITRATE (PF) 100 MCG/2ML IJ SOLN
INTRAMUSCULAR | Status: DC | PRN
  Administered 2021-02-09: 25 ug via INTRAVENOUS

## 2021-02-09 MED ORDER — LIDOCAINE 2% (20 MG/ML) 5 ML SYRINGE
INTRAMUSCULAR | Status: AC
  Filled 2021-02-09: qty 5

## 2021-02-09 SURGICAL SUPPLY — 47 items
APL SWBSTK 6 STRL LF DISP (MISCELLANEOUS) ×3
APPLICATOR COTTON TIP 6 STRL (MISCELLANEOUS) ×3 IMPLANT
APPLICATOR COTTON TIP 6IN STRL (MISCELLANEOUS) ×4
BIPOLAR CUTTING LOOP 21FR (ELECTRODE)
BLADE EXTENDED COATED 6.5IN (ELECTRODE) ×4 IMPLANT
BLADE SURG 11 STRL SS (BLADE) ×4 IMPLANT
CATH ROBINSON RED A/P 16FR (CATHETERS) IMPLANT
COVER WAND RF STERILE (DRAPES) ×4 IMPLANT
DEVICE MYOSURE LITE (MISCELLANEOUS) IMPLANT
DEVICE MYOSURE REACH (MISCELLANEOUS) ×4 IMPLANT
DILATOR CANAL MILEX (MISCELLANEOUS) IMPLANT
DRSG TELFA 3X8 NADH (GAUZE/BANDAGES/DRESSINGS) ×4 IMPLANT
ELECT BALL LEEP 5MM RED (ELECTRODE) IMPLANT
GAUZE 4X4 16PLY RFD (DISPOSABLE) ×4 IMPLANT
GLOVE SURG ENC MOIS LTX SZ6 (GLOVE) IMPLANT
GLOVE SURG ENC MOIS LTX SZ6.5 (GLOVE) IMPLANT
GLOVE SURG UNDER POLY LF SZ6.5 (GLOVE) ×12 IMPLANT
GLOVE SURG UNDER POLY LF SZ7 (GLOVE) ×4 IMPLANT
GLOVE SURG UNDER POLY LF SZ7.5 (GLOVE) ×8 IMPLANT
GOWN STRL REUS W/TWL LRG LVL3 (GOWN DISPOSABLE) ×12 IMPLANT
HEMOSTAT SURGICEL 4X8 (HEMOSTASIS) ×4 IMPLANT
IV NS IRRIG 3000ML ARTHROMATIC (IV SOLUTION) ×4 IMPLANT
KIT TURNOVER CYSTO (KITS) ×4 IMPLANT
LOOP CUTTING BIPOLAR 21FR (ELECTRODE) IMPLANT
MYOSURE XL FIBROID REM (MISCELLANEOUS)
NEEDLE SPNL 22GX3.5 QUINCKE BK (NEEDLE) ×4 IMPLANT
NS IRRIG 1000ML POUR BTL (IV SOLUTION) IMPLANT
NS IRRIG 500ML POUR BTL (IV SOLUTION) ×4 IMPLANT
PACK VAGINAL MINOR WOMEN LF (CUSTOM PROCEDURE TRAY) ×4 IMPLANT
PACK VAGINAL WOMENS (CUSTOM PROCEDURE TRAY) ×4 IMPLANT
PAD OB MATERNITY 4.3X12.25 (PERSONAL CARE ITEMS) ×4 IMPLANT
PAD PREP 24X48 CUFFED NSTRL (MISCELLANEOUS) ×4 IMPLANT
PENCIL SMOKE EVACUATOR (MISCELLANEOUS) IMPLANT
SEAL ROD LENS SCOPE MYOSURE (ABLATOR) ×4 IMPLANT
SPONGE SURGIFOAM ABS GEL 12-7 (HEMOSTASIS) IMPLANT
SUT VIC AB 0 CT1 36 (SUTURE) ×8 IMPLANT
SUT VIC AB 2-0 SH 27 (SUTURE)
SUT VIC AB 2-0 SH 27XBRD (SUTURE) IMPLANT
SUT VIC AB 2-0 UR6 27 (SUTURE) IMPLANT
SWAB OB GYN 8IN STERILE 2PK (MISCELLANEOUS) ×4 IMPLANT
SYR BULB IRRIG 60ML STRL (SYRINGE) ×4 IMPLANT
SYSTEM TISS REMOVAL MYSR XL RM (MISCELLANEOUS) IMPLANT
TOWEL OR 17X26 10 PK STRL BLUE (TOWEL DISPOSABLE) ×4 IMPLANT
TUBE CONNECTING 12X1/4 (SUCTIONS) ×4 IMPLANT
UNDERPAD 30X36 HEAVY ABSORB (UNDERPADS AND DIAPERS) ×4 IMPLANT
VACUUM HOSE/TUBING 7/8INX6FT (MISCELLANEOUS) IMPLANT
WATER STERILE IRR 500ML POUR (IV SOLUTION) IMPLANT

## 2021-02-09 NOTE — Op Note (Addendum)
PATIENT: Brenda Chavez DATE: 02/09/21  Preop Diagnosis: atypical squamous and glandular cells on prior pap/colpo/ EMB sampling  Postoperative Diagnosis: same as above, endometrial polyp  Surgery: Hysteroscopy with hysteroscopic polypectomy (using the Myosure device), cold knife conization, ECC  Surgeons:  Valarie Cones MD  Assistant: Joylene John NP  Anesthesia: LMA  Estimated blood loss: 20 ml  IVF:  See I&O flowsheet   Fluid balance from hysteroscopy: deficit of 235cc  Urine output: n/a   Complications: None apparent  Pathology: endometrial polyp, cervical conization, post-cone ECC  Operative findings: Small mobile uterus. No adnexal masses. On hysteroscopy, 1cm fundal polyp noted. Endometrium atrophic without lesions. Cervix small and atrophic. After Lugols applied, only glandular epithelium at the transformational zone did not have uptake. No lesions noted.  Procedure: The patient was identified in the preoperative holding area. Informed consent was signed on the chart. Patient was seen history was reviewed and exam was performed.   The patient was then taken to the operating room and placed in the supine position with SCD hose on. General anesthesia was then induced without difficulty. She was then placed in the dorsolithotomy position. The perineum was prepped with Betadine. The vagina was prepped with Betadine. The patient was then draped after the prep was dried.   Timeout was performed the patient, procedure, antibiotic, allergy, and length of procedure.   The speculum was placed in the posterior vagina. The single tooth tenaculum was placed on the anterior lip of the cervix. The uterine sound was placed in the cervix and advanced to the fundus. The cervix was successively dilated using pratts dilators to 24F. The Myosure hysteroscope was advanced into the uterine cavity with findings above. The Myosure Reach was used to resect the polyp lesion (pictures taken  before and after). The Myosure was then removed from the uterus.  The tenaculum was removed and hemostasis was observed.   The speculum was removed. The weighted speculum was placed in the posterior vagina. The deaver retractor was placed anteriorally to visualize the cervix. A 0-vicryl suture on a UR-6 needle was used to place stay sutures (which were tagged) at 3 and 9 o'clock of the cervicovaginal junction. 10cc of 0.25% marcaine plain was infiltrated into 4 and 8 o'clock of the cervicovaginal junction. The uterine sound was placed in the cervix to delineate the course of the endocervical canal. An 11 blade scalpel on a long knife handle was used to make a narrow incision around the os on the face of the cervix, well inside of the stay sutures, circumferentially. An Allis grasped the specimen to manipulate it. The incision was then angled towards the endocervix to amputate the specimen using Mayo scissors. It was removed, oriented with a marking stitch at the 12 o'clock ectocervix and sent to pathology.  A post-cone ECC was collected from the endocervical canal using a kavorkian currette.  The bovie was used at 81 coag to create hemostasis at the surgical bed. Surgicel was placed within the cone bed and the stay sutures tied down. Good hemostasis was noted.   The vagina was irrigated.  All instrument, suture, laparotomy, Ray-Tec, and needle counts were correct x2.  The patient tolerated the procedure well and was taken recovery room in stable condition.   Brenda Pinch MD Gynecologic Oncology

## 2021-02-09 NOTE — Anesthesia Preprocedure Evaluation (Addendum)
Anesthesia Evaluation  Patient identified by MRN, date of birth, ID band Patient awake    Reviewed: Allergy & Precautions, NPO status , Patient's Chart, lab work & pertinent test results, reviewed documented beta blocker date and time   History of Anesthesia Complications (+) Family history of anesthesia reaction  Airway Mallampati: I       Dental no notable dental hx.    Pulmonary neg pulmonary ROS,    Pulmonary exam normal        Cardiovascular + CAD, + Past MI and + Cardiac Stents  Normal cardiovascular exam  Non STEMI 05/14/2015 PTCA with DES  D1 100%- 0%  Echo 12/11/19 1. Left ventricular ejection fraction, by estimation, is 60 to 65%. The left ventricle has normal function. The left ventricle has no regional wall motion abnormalities. Left ventricular diastolic parameters are consistent with Grade II diastolic dysfunction (pseudonormalization). The average left ventricular global longitudinal strain is -21.0 %.  2. Right ventricular systolic function is normal. The right ventricular size is normal. There is normal pulmonary artery systolic pressure.  3. The mitral valve is normal in structure. No evidence of mitral valve regurgitation. No evidence of mitral stenosis.  4. The aortic valve is normal in structure. Aortic valve regurgitation is not visualized. No aortic stenosis is present.  5. The inferior vena cava is normal in size with greater than 50% respiratory variability, suggesting right atrial pressure of 3 mmHg.   EKG 02/07/21 SB , otherwise normal  Cardiac Cath 05/14/15  Dist LAD lesion, 30% stenosed.  Ost 1st Diag to 1st Diag lesion, 100% stenosed. There is a 0% residual stenosis post intervention.  A drug-eluting stent was placed.  There is mild left ventricular systolic dysfunction.   1. NSTEMI secondary to thrombotic occlusion of the moderate caliber Diagonal branch 2. Successful PTCA/DES x 1 Diagonal  branch 3. Normal LV systolic function    Neuro/Psych Anxiety Glaucoma    GI/Hepatic Neg liver ROS, GERD  Medicated and Controlled,  Endo/Other  diabetes, Well Controlled, Type 2Hyperlipidemia Diet controlled type 2 DM Osteoporosis  Renal/GU negative Renal ROS  negative genitourinary   Musculoskeletal negative musculoskeletal ROS (+)   Abdominal Normal abdominal exam  (+)   Peds  Hematology negative hematology ROS (+)   Anesthesia Other Findings  IMPRESSIONS    1. Left ventricular ejection fraction, by estimation, is 60 to 65%. The  left ventricle has normal function. The left ventricle has no regional  wall motion abnormalities. Left ventricular diastolic parameters are  consistent with Grade II diastolic  dysfunction (pseudonormalization). The average left ventricular global  longitudinal strain is -21.0 %.  2. Right ventricular systolic function is normal. The right ventricular  size is normal. There is normal pulmonary artery systolic pressure.  3. The mitral valve is normal in structure. No evidence of mitral valve  regurgitation. No evidence of mitral stenosis.  4. The aortic valve is normal in structure. Aortic valve regurgitation is  not visualized. No aortic stenosis is present.  5. The inferior vena cava is normal in size with greater than 50%  respiratory variability, suggesting right atrial pressure of 3 mmHg.   Comparison(s): 08/02/15 EF 55-60%.   FINDINGS  Left Ventricle: Left ventricular ejection fraction, by estimation, is 60  to 65%. The left ventricle has normal function. The left ventricle has no  regional wall motion abnormalities. The average left ventricular global  longitudinal strain is -21.0 %.  The left ventricular internal cavity size was normal in size. There  is no  left ventricular hypertrophy. Left ventricular diastolic parameters are  consistent with Grade II diastolic dysfunction (pseudonormalization).  Indeterminate filling  pressures.   Right Ventricle: The right ventricular size is normal. No increase in  right ventricular wall thickness. Right ventricular systolic function is  normal. There is normal pulmonary artery systolic pressure. The tricuspid  regurgitant velocity is 1.48 m/s, and  with an assumed right atrial pressure of 3 mmHg, the estimated right  ventricular systolic pressure is 99991111 mmHg.   Left Atrium: Left atrial size was normal in size.   Right Atrium: Right atrial size was normal in size.   Pericardium: There is no evidence of pericardial effusion.   Mitral Valve: The mitral valve is normal in structure. Normal mobility of  the mitral valve leaflets. No evidence of mitral valve regurgitation. No  evidence of mitral valve stenosis.   Tricuspid Valve: The tricuspid valve is normal in structure. Tricuspid  valve regurgitation is trivial. No evidence of tricuspid stenosis.   Aortic Valve: The aortic valve is normal in structure. Aortic valve  regurgitation is not visualized. No aortic stenosis is present. Aortic  valve mean gradient measures 4.0 mmHg. Aortic valve peak gradient measures  7.5 mmHg. Aortic valve area, by VTI  measures 2.48 cm.   Pulmonic Valve: The pulmonic valve was normal in structure. Pulmonic valve  regurgitation is not visualized. No evidence of pulmonic stenosis.   Aorta: The aortic root is normal in size and structure.   Venous: The inferior vena cava is normal in size with greater than 50%  respiratory variability, suggesting right atrial pressure of 3 mmHg.   IAS/Shunts: No atrial level shunt detected by color flow Doppler.     LEFT VENTRICLE  PLAX 2D  LVIDd:     4.30 cm Diastology  LVIDs:     2.90 cm LV e' lateral:  7.72 cm/s  LV PW:     0.80 cm LV E/e' lateral: 11.0  LV IVS:    0.80 cm LV e' medial:  7.94 cm/s  LVOT diam:   1.90 cm LV E/e' medial: 10.7  LV SV:     78  LV SV Index:  43    2D Longitudinal Strain   LVOT Area:   2.84 cm 2D Strain GLS (A2C):  -19.9 %             2D Strain GLS (A3C):  -20.6 %             2D Strain GLS (A4C):  -22.7 %             2D Strain GLS Avg:   -21.0 %               3D Volume EF:             3D EF:    61 %             LV EDV:    99 ml             LV ESV:    39 ml             LV SV:    60 ml   RIGHT VENTRICLE  RV Basal diam: 2.60 cm  RV S prime:   10.20 cm/s  TAPSE (M-mode): 2.0 cm  RVSP:      11.8 mmHg   LEFT ATRIUM       Index    RIGHT ATRIUM  Index  LA diam:    3.70 cm 2.03 cm/m RA Pressure: 3.00 mmHg  LA Vol (A2C):  19.0 ml 10.41 ml/m RA Area:   8.84 cm  LA Vol (A4C):  25.6 ml 14.02 ml/m RA Volume:  16.70 ml 9.15 ml/m  LA Biplane Vol: 22.0 ml 12.05 ml/m  AORTIC VALVE  AV Area (Vmax):  2.42 cm  AV Area (Vmean):  2.63 cm  AV Area (VTI):   2.48 cm  AV Vmax:      137.00 cm/s  AV Vmean:     87.700 cm/s  AV VTI:      0.314 m  AV Peak Grad:   7.5 mmHg  AV Mean Grad:   4.0 mmHg  LVOT Vmax:     117.00 cm/s  LVOT Vmean:    81.400 cm/s  LVOT VTI:     0.275 m  LVOT/AV VTI ratio: 0.88    AORTA  Ao Root diam: 2.80 cm  Ao Asc diam: 2.90 cm   MITRAL VALVE        TRICUSPID VALVE               TR Peak grad:  8.8 mmHg               TR Vmax:    148.00 cm/s  MV E velocity: 85.30 cm/s Estimated RAP: 3.00 mmHg  MV A velocity: 77.10 cm/s RVSP:      11.8 mmHg  MV E/A ratio: 1.11               SHUNTS               Systemic VTI: 0.28 m               Systemic Diam: 1.90 cm   Skeet Latch MD  Electronically signed by Skeet Latch MD  Signature Date/Time: 12/11/2019/4:04:44 PM      Final    Imaging Info  ECHOCARDIOGRAM COMPLETE (Order  #[id number]) on 12/11/19  Study History  ECHOCARDIOGRAM COMPLETE (Order #[id number]) on 12/11/19  [employer] Images  Show images for ECHOCARDIOGRAM COMPLETE  Images on [employer]  Show images for Wallie Renshaw  Performing Technologist/Nurse  Performing Technologist/Nurse: Etter Sjogren, RDCS   Reason for Exam Priority: Routine Dx: Coronary artery disease involving native coronary artery of native heart without angina pectoris (I25.10 (ICD-10-CM)); Pure hypercholesterolemia (E78.00 (ICD-10-CM))  Surgical History   Surgical History   Procedure Laterality Date Comment Source CARDIAC CATHETERIZATION N/A 05/14/2015 Procedure: Left Heart Cath and Coronary Angiography; Surgeon: Burnell Blanks, MD; Location: [facility]; Service: Cardiovascular; Laterality: N/A;  CARDIAC CATHETERIZATION N/A 05/14/2015 Procedure: Coronary Stent Intervention; Surgeon: Burnell Blanks, MD; Location: [facility]; Service: Cardiovascular; Laterality: N/A;    Other Surgical History   Procedure Laterality Date Comment Source DILATION AND CURETTAGE OF UTERUS  2021   WISDOM TOOTH EXTRACTION  1984     Implants    Permanent Stent  Stent Promus Prem Mr 2.25x12 - [id number] - Implanted   Inventory item: Valere Dross MR 4.58K99 Model/Cat number: [id number] Manufacturer: [employer] Lot number: [id number] Device identifier: [id number] Device identifier type: GS1  As of 05/14/2015   Status: Implanted     Encounter-Level Documents on 12/11/2019:  Electronic signature on 12/11/2019 11:17 AM - E-signed Electronic signature on 12/11/2019 11:16 AM - E-signed    Order-Level Documents on 12/11/2019:  Scan on 12/11/2019 4:05 PM by Default, Provider, MD    Resulted  by:  Signed Date/Time  Phone Pager [place], [facility] 12/11/2019 4:04 PM [phone]   External Result Report  External Result Report  ECHOCARDIOGRAM COMPLETE: Patient  Communication  Add Comments Not seen   Existing Charges  Charge Line Charge Code Status Charge Trigger Charge Type [id number] Hc Tte 2d Echo Complete ([id number]) [facility] end exam Technical [id number] Hc 3d Rendering At [place] ([id number]) [address] [facility] Billing Imaging end exam Technical [id number] ECHO [place] [facility] result study Professional [id number] 3D RENDERING W/INTERP & POSTPROCESS SUPERVISION [place] [facility] result study Professional    Reproductive/Obstetrics                           Anesthesia Physical Anesthesia Plan  ASA: II  Anesthesia Plan: General   Post-op Pain Management:    Induction: Intravenous  PONV Risk Score and Plan: Treatment may vary due to age or medical condition and Ondansetron  Airway Management Planned: LMA  Additional Equipment: None  Intra-op Plan:   Post-operative Plan: Extubation in OR  Informed Consent: I have reviewed the patients History and Physical, chart, labs and discussed the procedure including the risks, benefits and alternatives for the proposed anesthesia with the patient or authorized representative who has indicated his/her understanding and acceptance.     Dental advisory given  Plan Discussed with: CRNA  Anesthesia Plan Comments:        Anesthesia Quick Evaluation

## 2021-02-09 NOTE — Transfer of Care (Signed)
Immediate Anesthesia Transfer of Care Note  Patient: Brenda Chavez  Procedure(s) Performed: COLD KNIFE CONIZATION CERVIX (N/A Cervix) DILATATION AND CURETTAGE /HYSTEROSCOPY WITH MYOSURE, POLYPECTOMY (N/A Vagina )  Patient Location: PACU  Anesthesia Type:General  Level of Consciousness: drowsy  Airway & Oxygen Therapy: Patient Spontanous Breathing and Patient connected to face mask oxygen  Post-op Assessment: Report given to RN and Post -op Vital signs reviewed and stable  Post vital signs: Reviewed and stable  Last Vitals:  Vitals Value Taken Time  BP 120/64 02/09/21 1431  Temp    Pulse 72 02/09/21 1432  Resp 13 02/09/21 1432  SpO2 100 % 02/09/21 1432  Vitals shown include unvalidated device data.  Last Pain: There were no vitals filed for this visit.       Complications: No complications documented.

## 2021-02-09 NOTE — Anesthesia Postprocedure Evaluation (Signed)
Anesthesia Post Note  Patient: Brenda Chavez  Procedure(s) Performed: COLD KNIFE CONIZATION CERVIX (N/A Cervix) DILATATION AND CURETTAGE /HYSTEROSCOPY WITH MYOSURE, POLYPECTOMY (N/A Vagina )     Patient location during evaluation: PACU Anesthesia Type: General Level of consciousness: awake Pain management: pain level controlled Vital Signs Assessment: post-procedure vital signs reviewed and stable Respiratory status: spontaneous breathing Cardiovascular status: stable Postop Assessment: no apparent nausea or vomiting Anesthetic complications: no   No complications documented.  Last Vitals:  Vitals:   02/09/21 1500 02/09/21 1515  BP: 137/69 137/70  Pulse: 60 (!) 59  Resp: 13 12  Temp:    SpO2: 100% 100%    Last Pain: There were no vitals filed for this visit.               Huston Foley

## 2021-02-09 NOTE — Discharge Instructions (Addendum)
02/09/2021  Return to work: 2 weeks if applicable  Today Dr. Berline Lopes did a dilation and curettage with Myosure and removed a polyp in the uterus. She also took a cone shaped biopsy of the cervix and a sample from the endocervix.  There was a special type of gauze that was placed on the cervix after the cone biopsy to help stop bleeding. You can expect to see this fall out in the next several days. This is normal.  Activity: 1. Be up and out of the bed during the day.  Take a nap if needed.  You may walk up steps but be careful and use the hand rail.  Stair climbing will tire you more than you think, you may need to stop part way and rest.   2. No lifting or straining for 2 weeks.  3. No driving for minimum of 24 hours after the procedure.  Do not drive if you are taking narcotic pain medicine.  4. Shower daily. No tub baths or submerging your body in water until cleared by your surgeon (until seen in the office).   5. No sexual activity and nothing in the vagina for 4 weeks.  7. You may experience vaginal spotting and discharge after surgery.  The spotting is normal but if you experience heavy bleeding, call our office.  8. Take Tylenol or ibuprofen first for pain and only use narcotic pain medication for severe pain not relieved by the Tylenol or Ibuprofen (use sparingly with celexa since it can increase risk of gastrointestinal bleeding.  Monitor your Tylenol intake to a max of 4,000 mg.  Diet: 1. Low sodium Heart Healthy Diet is recommended.  2. It is safe to use a laxative, such as Miralax or Colace, if you have difficulty moving your bowels. You can take Sennakot at bedtime every evening to keep bowel movements regular and to prevent constipation.    Wound Care: 1. Keep clean and dry.  Shower daily.  Reasons to call the Doctor:  Fever - Oral temperature greater than 100.4 degrees Fahrenheit  Foul-smelling vaginal discharge  Difficulty urinating  Nausea and  vomiting  Increased pain at the site of the incision that is unrelieved with pain medicine.  Difficulty breathing with or without chest pain  New calf pain especially if only on one side  Sudden, continuing increased vaginal bleeding with or without clots.   Contacts: For questions or concerns you should contact:  Dr. Jeral Pinch at [phone]  Joylene John, NP at [phone]  After Hours: call [phone] and have the GYN Oncologist paged/contacted   Cervical Conization, Care After  This sheet gives you information about how to care for yourself after your procedure. Your doctor may also give you more specific instructions. If you have problems or questions, contact your doctor. What can I expect after the procedure? After the procedure, it is common to have:  A groggy feeling, if you were given medicine to make you fall asleep (general anesthetic).  Cramps that feel like menstrual cramps.  Bloody discharge or light to moderate bleeding.  Dark fluid from the vagina (vaginal discharge). This fluid may look similar to coffee grounds. Follow these instructions at home: Medicines  Take over-the-counter and prescription medicines only as told by your doctor.  Do not take aspirin until your doctor says it is okay. Activity  Rest as told by your doctor.  For 7-14 days after your procedure, avoid: ? Being very active. ? Exercising. ? Heavy lifting.  Do not  lift anything that is heavier than 10 lb (4.5 kg), or the limit that you are told, until your doctor says that it is safe.  Return to your normal activities as told by your doctor. Ask your doctor what activities are safe for you.   General instructions  You may eat your normal diet unless your doctor tells you not to do so.  Drink enough fluid to keep your pee (urine) pale yellow.  Do not take baths, swim, or use a hot tub until your doctor approves. Ask your doctor if you may take showers. You may only  be allowed to take sponge baths.  Do not douche, have sex, or put anything in the vagina, including tampons, until your doctor says it is okay.  Keep all follow-up visits as told by your doctor. This is important.   Contact a doctor if:  You get a rash.  You are dizzy or light-headed.  You feel like you may vomit (nauseous).  You vomit.  You have fluid from your vagina that smells bad. Get help right away if:  There are bloody clumps (clots) coming from your vagina.  You have more bleeding than you would have in a normal menstrual period. For example, you soak a pad in less than 1 hour.  You have a fever.  You have more and more cramps.  You pass out (faint).  You have pain when peeing.  You have very bad pain, or your pain gets worse. Medicine does not help your pain.  You have blood in your pee. Summary  After the procedure, it is common to have cramps, some bleeding, and dark or bloody fluid from your vagina.  Do not douche, have sex, or use tampons until your doctor says it is okay.  For about 7-14 days after your procedure, try not to exercise or lift heavy objects. This information is not intended to replace advice given to you by your health care provider. Make sure you discuss any questions you have with your health care provider. Document Revised: 03/10/2019 Document Reviewed: 03/10/2019 Elsevier Patient Education  2021 [place] Instructions  Activity: Get plenty of rest for the remainder of the day. A responsible individual must stay with you for 24 hours following the procedure.  For the next 24 hours, DO NOT: -Drive a car -Paediatric nurse -Drink alcoholic beverages -Take any medication unless instructed by your physician -Make any legal decisions or sign important papers.  Meals: Start with liquid foods such as gelatin or soup. Progress to regular foods as tolerated. Avoid greasy, spicy, heavy foods. If nausea  and/or vomiting occur, drink only clear liquids until the nausea and/or vomiting subsides. Call your physician if vomiting continues.  Special Instructions/Symptoms: Your throat may feel dry or sore from the anesthesia or the breathing tube placed in your throat during surgery. If this causes discomfort, gargle with warm salt water. The discomfort should disappear within 24 hours.  If you had a scopolamine patch placed behind your ear for the management of post- operative nausea and/or vomiting:  1. The medication in the patch is effective for 72 hours, after which it should be removed.  Wrap patch in a tissue and discard in the trash. Wash hands thoroughly with soap and water. 2. You may remove the patch earlier than 72 hours if you experience unpleasant side effects which may include dry mouth, dizziness or visual disturbances. 3. Avoid touching the patch. Wash your hands  with soap and water after contact with the patch.    Scopolamine patch removed at 2:31 PM. If you have any changes in your vision, contact your eye doctor immediately.

## 2021-02-09 NOTE — Anesthesia Procedure Notes (Signed)
Procedure Name: LMA Insertion Date/Time: 02/09/2021 1:42 PM Performed by: Gwyndolyn Saxon, CRNA Pre-anesthesia Checklist: Patient identified, Emergency Drugs available, Suction available and Patient being monitored Patient Re-evaluated:Patient Re-evaluated prior to induction Oxygen Delivery Method: Circle system utilized Preoxygenation: Pre-oxygenation with 100% oxygen Induction Type: IV induction Ventilation: Mask ventilation without difficulty LMA: LMA inserted LMA Size: 4.0 Number of attempts: 1 Placement Confirmation: positive ETCO2 and breath sounds checked- equal and bilateral Tube secured with: Tape Dental Injury: Teeth and Oropharynx as per pre-operative assessment

## 2021-02-09 NOTE — Interval H&P Note (Signed)
History and Physical Interval Note:  02/09/2021 11:13 AM  Brenda Chavez  has presented today for surgery, with the diagnosis of ABNORMAL PAP SMEAR.  The various methods of treatment have been discussed with the patient and family. After consideration of risks, benefits and other options for treatment, the patient has consented to  Procedure(s): COLPOSCOPY WITH CERVICAL BIOPSY (N/A) CONIZATION CERVIX (N/A) ENDOCERVICAL CURETTAGE (N/A) DILATATION AND CURETTAGE /HYSTEROSCOPY WITH MYOSURE (N/A) OPERATIVE ULTRASOUND (N/A) as a surgical intervention.  The patient's history has been reviewed, patient examined, no change in status, stable for surgery.  I have reviewed the patient's chart and labs.  Questions were answered to the patient's satisfaction.     Lafonda Mosses

## 2021-02-10 ENCOUNTER — Encounter (HOSPITAL_BASED_OUTPATIENT_CLINIC_OR_DEPARTMENT_OTHER): Payer: Self-pay | Admitting: Gynecologic Oncology

## 2021-02-10 ENCOUNTER — Telehealth: Payer: Self-pay

## 2021-02-10 NOTE — Telephone Encounter (Signed)
Brenda Chavez states that she is eating,drinking, and urinating well. She rates her pain 1/10 and has not needed pain medicine. She is passing gas,afebrile. Patient states light spotting.  Pt aware of post op appointments as well as the office number [phone] and after hours number [phone] to call if she has any questions or concerns.

## 2021-02-11 LAB — SURGICAL PATHOLOGY

## 2021-02-13 NOTE — Progress Notes (Signed)
Chief Complaint  Patient presents with  . Follow-up    CAD   History of Present Illness: 60 yo female with history of CAD and HLD here today for cardiac follow up. She was admitted to [facility] August 2016 with a NSTEMI and found to have a severe stenosis in the Diagonal branch treated with a drug eluting stent. Echo March 2021 with normal LV function, no valve disease.   She is here today for follow up. The patient denies any chest pain, dyspnea, palpitations, lower extremity edema, orthopnea, PND, dizziness, near syncope or syncope. 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 Her eye doctor has recommended carotid artery dopplers.   Primary Care Physician: Jearld Fenton, NP  Past Medical History:  Diagnosis Date  . Abnormal Pap smear of cervix   . Anxiety   . CAD (coronary artery disease) cardiologist--- dr Angelena Form   a. 04/2015 NSTEMI/PCI: LM nl, LAD 30d, D1 100 (2.25x12 Promus Premier DES), D2 small, RI small, LCX nl, OM1/2 nl, RCA/PDA nl, EF 45-50%.  . Diabetes mellitus type 2, diet-controlled ([place])    followed by pcp--- newly dx 03/ 2022  . Family history of adverse reaction to anesthesia    mother-- ponv  . GERD (gastroesophageal reflux disease)   . Glaucoma, both eyes   . History of non-ST elevation myocardial infarction (NSTEMI) 05/14/2015   s/p  PCI and DES  . Hyperlipidemia   . Osteoporosis   . S/P drug eluting coronary stent placement 05/14/2015   DES x1 to D1  . Wears glasses     Past Surgical History:  Procedure Laterality Date  . CARDIAC CATHETERIZATION N/A 05/14/2015   Procedure: Left Heart Cath and Coronary Angiography;  Surgeon: Burnell Blanks, MD;  Location: [place] [facility];  Service: Cardiovascular;  Laterality: N/A;  . CARDIAC CATHETERIZATION N/A 05/14/2015   Procedure: Coronary Stent Intervention;  Surgeon: Burnell Blanks, MD;  Location: [place] [facility];  Service: Cardiovascular;  Laterality: N/A;  . CERVICAL CONIZATION W/BX N/A 02/09/2021   Procedure: COLD  KNIFE CONIZATION CERVIX;  Surgeon: Lafonda Mosses, MD;  Location: [facility];  Service: Gynecology;  Laterality: N/A;  . DILATION AND CURETTAGE OF UTERUS  2021  . HYSTEROSCOPY WITH D & C N/A 02/09/2021   Procedure: DILATATION AND CURETTAGE /HYSTEROSCOPY WITH MYOSURE, POLYPECTOMY;  Surgeon: Lafonda Mosses, MD;  Location: [place];  Service: Gynecology;  Laterality: N/A;  . WISDOM TOOTH EXTRACTION  1984   Current Medications:   Current Outpatient Medications:  Current Meds  Medication Sig  . acetaminophen (TYLENOL) 325 MG tablet Take 2 tablets (650 mg total) by mouth every 4 (four) hours as needed for headache or mild pain.  . Ascorbic Acid (VITAMIN C) 1000 MG tablet Take 1,000 mg by mouth daily.  Marland Kitchen aspirin EC 81 MG tablet Take 1 tablet (81 mg total) by mouth daily.  . cholecalciferol (VITAMIN D) 1000 UNITS tablet Take 1,000 Units by mouth daily.  Marland Kitchen CINNAMON PO Take 1 capsule by mouth 2 (two) times daily.  . citalopram (CELEXA) 40 MG tablet TAKE 1 TABLET BY MOUTH DAILY. MUST SCHEDULE ANNUAL EXAM FOR MORE REFILLS  . esomeprazole (NEXIUM) 20 MG capsule Take 20 mg by mouth daily.  . Ginkgo Biloba (GINKOBA PO) Take 500 mg by mouth 2 (two) times daily.  Marland Kitchen latanoprost (XALATAN) 0.005 % ophthalmic solution Place 1 drop into both eyes at bedtime.  Marland Kitchen MAGNESIUM CITRATE PO Take 250 mg by mouth daily.  . nitroGLYCERIN (  NITROSTAT) 0.4 MG SL tablet Place 1 tablet (0.4 mg total) under the tongue every 5 (five) minutes x 3 doses as needed for chest pain.  . [DISCONTINUED] atorvastatin (LIPITOR) 40 MG tablet Take 1 tablet (40 mg total) by mouth daily. Please keep upcoming appt for further refills  . [DISCONTINUED] metoprolol tartrate (LOPRESSOR) 25 MG tablet Take 0.5 tablets (12.5 mg total) by mouth 2 (two) times daily. Please make yearly appt with Dr. Angelena Form for February for future refills. Thank you 1st attempt    Allergies  Allergen Reactions  . Erythromycin  Nausea And Vomiting  . Floxin [Ofloxacin] Other (See Comments)    Unable to close eyes  . Macrobid [Nitrofurantoin Macrocrystal] Nausea And Vomiting    Social History   Socioeconomic History  . Marital status: Married    Spouse name: Not on file  . Number of children: Not on file  . Years of education: Not on file  . Highest education level: Not on file  Occupational History  . Not on file  Tobacco Use  . Smoking status: Never Smoker  . Smokeless tobacco: Never Used  Vaping Use  . Vaping Use: Never used  Substance and Sexual Activity  . Alcohol use: No  . Drug use: Never  . Sexual activity: Yes    Birth control/protection: Post-menopausal  Other Topics Concern  . Not on file  Social History Narrative  . Not on file   Social Determinants of Health   Financial Resource Strain: Not on file  Food Insecurity: Not on file  Transportation Needs: Not on file  Physical Activity: Not on file  Stress: Not on file  Social Connections: Not on file  Intimate Partner Violence: Not on file    Family History  Problem Relation Age of Onset  . Breast cancer Cousin 29  . Heart disease Mother   . Heart disease Father   . COPD Father   . Heart attack Maternal Grandmother   . Heart attack Paternal Grandmother   . Breast cancer Maternal Aunt 31  . Diabetes Maternal Aunt   . Heart disease Paternal Aunt   . Colon cancer Cousin   . Breast cancer Cousin   . Ovarian cancer Neg Hx   . Pancreatic cancer Neg Hx   . Endometrial cancer Neg Hx     Review of Systems:  As stated in the HPI and otherwise negative.   BP 118/78   Pulse (!) 59   Ht 5\' 4"  (1.626 m)   Wt 154 lb 3.2 oz (69.9 kg)   SpO2 97%   BMI 26.47 kg/m   Physical Examination:  General: Well developed, well nourished, NAD  HEENT: OP clear, mucus membranes moist  SKIN: warm, dry. No rashes. Neuro: No focal deficits  Musculoskeletal: Muscle strength 5/5 all ext  Psychiatric: Mood and affect normal  Neck: No JVD, no  carotid bruits, no thyromegaly, no lymphadenopathy.  Lungs:Clear bilaterally, no wheezes, rhonci, crackles Cardiovascular: Regular rate and rhythm. No murmurs, gallops or rubs. Abdomen:Soft. Bowel sounds present. Non-tender.  Extremities: No lower extremity edema. Pulses are 2 + in the bilateral DP/PT.  Cardiac cath 05/14/15: Left Anterior Descending  The vessel is large .   Marland Kitchen Dist LAD lesion, 30% stenosed. discrete .   Marland Kitchen First Diagonal Branch   The vessel is moderate in size.   Colon Flattery 1st Diag to 1st Diag lesion, 100% stenosed. thrombotic .   Marland Kitchen PCI: There is no pre-interventional antegrade distal flow (TIMI 0). Pre-stent angioplasty  was performed. A drug-eluting stent was placed. The strut is apposed. Post-stent angioplasty was performed. The post-interventional distal flow is normal (TIMI 3). The intervention was successful. No complications occurred at this lesion.  . There is no residual stenosis post intervention.     . Second Diagonal Branch   The vessel is small in size.      [place] Intermedius  The vessel is small .     Left Circumflex  The vessel is large is angiographically normal.   . First Obtuse Marginal Branch   The vessel is small in size.   Marland Kitchen Second Obtuse Marginal Branch   The vessel is moderate in size.     Right Coronary Artery  The vessel is moderate in size is angiographically normal.   . Right Posterior Descending Artery   The vessel is moderate in size and is angiographically normal.     Echo March 2021: .1. Left ventricular ejection fraction, by estimation, is 60 to 65%. The  left ventricle has normal function. The left ventricle has no regional  wall motion abnormalities. Left ventricular diastolic parameters are  consistent with Grade II diastolic  dysfunction (pseudonormalization). The average left ventricular global  longitudinal strain is -21.0 %.  2. Right ventricular systolic function is normal. The right ventricular  size is normal. There  is normal pulmonary artery systolic pressure.  3. The mitral valve is normal in structure. No evidence of mitral valve  regurgitation. No evidence of mitral stenosis.  4. The aortic valve is normal in structure. Aortic valve regurgitation is  not visualized. No aortic stenosis is present.  5. The inferior vena cava is normal in size with greater than 50%  respiratory variability, suggesting right atrial pressure of 3 mmHg.   EKG:  EKG is not ordered today. The ekg ordered today demonstrates   Recent Labs: 02/07/2021: BUN 23; Creatinine, Ser 0.85; Hemoglobin 14.4; Platelets 261; Potassium 4.5; Sodium 140   Lipid Panel    Component Value Date/Time   CHOL 127 01/28/2020 0947   TRIG 127 01/28/2020 0947   HDL 58 01/28/2020 0947   CHOLHDL 2.2 01/28/2020 0947   CHOLHDL 1.5 07/07/2015 0813   VLDL 16 07/07/2015 0813   LDLCALC 47 01/28/2020 0947     Wt Readings from Last 3 Encounters:  02/14/21 154 lb 3.2 oz (69.9 kg)  02/09/21 152 lb 12.8 oz (69.3 kg)  02/07/21 150 lb 2 oz (68.1 kg)     Other studies Reviewed: Additional studies/ records that were reviewed today include: . Review of the above records demonstrates:    Assessment and Plan:   1. CAD without angina: She had a NSTEMI in August 2016 and had a DES placed in the Diagonal branch. She has no chest pain. Will continue ASA, beta blocker and statin.    2. Hyperlipidemia: LDL at goal in February in primary care. LDL 61. Continue statin.   3. 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  The patient does not have concerns regarding medicines.  The following changes have been made:  no change  Labs/ tests ordered today include:   Orders Placed This Encounter  Procedures  . VAS US CAROTID   Disposition:   F/U with me in 12 months  Signed, Lauree Chandler, MD 02/14/2021 8:32 AM    [facility] [address], [place], [place]  [address] Phone: [phone]; Fax: [phone]

## 2021-02-14 ENCOUNTER — Other Ambulatory Visit: Payer: Self-pay

## 2021-02-14 ENCOUNTER — Telehealth: Payer: Self-pay | Admitting: Gynecologic Oncology

## 2021-02-14 ENCOUNTER — Encounter: Payer: Self-pay | Admitting: Cardiovascular Disease

## 2021-02-14 ENCOUNTER — Ambulatory Visit (INDEPENDENT_AMBULATORY_CARE_PROVIDER_SITE_OTHER): Payer: 59 | Admitting: Cardiovascular Disease

## 2021-02-14 VITALS — BP 118/78 | HR 59 | Ht 64.0 in | Wt 154.2 lb

## 2021-02-14 DIAGNOSIS — H579 Unspecified disorder of eye and adnexa: Secondary | ICD-10-CM | POA: Diagnosis not present

## 2021-02-14 DIAGNOSIS — I2510 Atherosclerotic heart disease of native coronary artery without angina pectoris: Secondary | ICD-10-CM

## 2021-02-14 DIAGNOSIS — E7800 Pure hypercholesterolemia, unspecified: Secondary | ICD-10-CM

## 2021-02-14 MED ORDER — METOPROLOL TARTRATE 25 MG PO TABS: 12.5000 mg | ORAL_TABLET | Freq: Two times a day (BID) | ORAL | 3 refills | Status: DC

## 2021-02-14 MED ORDER — ATORVASTATIN CALCIUM 40 MG PO TABS: 40.0000 mg | ORAL_TABLET | Freq: Every day | ORAL | 3 refills | Status: DC

## 2021-02-14 NOTE — Patient Instructions (Signed)
Medication Instructions:  No changes *If you need a refill on your cardiac medications before your next appointment, please call your pharmacy*   Lab Work: none If you have labs (blood work) drawn today and your tests are completely normal, you will receive your results only by: Marland Kitchen MyChart Message (if you have MyChart) OR . A paper copy in the mail If you have any lab test that is abnormal or we need to change your treatment, we will call you to review the results.   Testing/Procedures: Your physician has requested that you have a carotid duplex. This test is an ultrasound of the carotid arteries in your neck. It looks at blood flow through these arteries that supply the brain with blood. Allow one hour for this exam. There are no restrictions or special instructions.   Follow-Up: At [facility], you and your health needs are our priority.  As part of our continuing mission to provide you with exceptional heart care, we have created designated Provider Care Teams.  These Care Teams include your primary Cardiologist (physician) and Advanced Practice Providers (APPs -  Physician Assistants and Nurse Practitioners) who all work together to provide you with the care you need, when you need it.  We recommend signing up for the patient portal called "MyChart".  Sign up information is provided on this After Visit Summary.  MyChart is used to connect with patients for Virtual Visits (Telemedicine).  Patients are able to view lab/test results, encounter notes, upcoming appointments, etc.  Non-urgent messages can be sent to your provider as well.   To learn more about what you can do with MyChart, go to [email or web address].    Your next appointment:   12 month(s)  The format for your next appointment:   In Person  Provider:   You may see Lauree Chandler, MD or one of the following Advanced Practice Providers on your designated Care Team:    Melina Copa, PA-C  Ermalinda Barrios,  PA-C

## 2021-02-14 NOTE — Telephone Encounter (Signed)
Called patient, reviewed pathology from surgery. All questions answered. Aware of follow-up in June.  Brenda Pinch MD Gynecologic Oncology

## 2021-02-15 ENCOUNTER — Ambulatory Visit (HOSPITAL_COMMUNITY)
Admission: RE | Admit: 2021-02-15 | Discharge: 2021-02-15 | Disposition: A | Discharge status: Home / Self Care | Payer: 59 | Source: Ambulatory Visit | Attending: Cardiovascular Disease | Admitting: Cardiovascular Disease

## 2021-02-15 ENCOUNTER — Ambulatory Visit: Payer: 59

## 2021-02-15 DIAGNOSIS — H579 Unspecified disorder of eye and adnexa: Secondary | ICD-10-CM

## 2021-02-15 DIAGNOSIS — I2510 Atherosclerotic heart disease of native coronary artery without angina pectoris: Secondary | ICD-10-CM

## 2021-02-15 DIAGNOSIS — E7800 Pure hypercholesterolemia, unspecified: Secondary | ICD-10-CM

## 2021-02-17 ENCOUNTER — Other Ambulatory Visit: Payer: Self-pay

## 2021-02-17 ENCOUNTER — Encounter: Payer: Self-pay | Admitting: Registered"

## 2021-02-17 ENCOUNTER — Encounter: Payer: 59 | Attending: Internal Medicine | Admitting: Registered"

## 2021-02-17 DIAGNOSIS — E119 Type 2 diabetes mellitus without complications: Secondary | ICD-10-CM | POA: Diagnosis not present

## 2021-02-17 NOTE — Patient Instructions (Signed)
Goals:  Follow Diabetes Meal Plan as instructed  Eat 3 meals and 2 snacks, every 3-5 hrs  Limit carbohydrate intake to 30-45 grams carbohydrate/meal  Meals = 1/2 plate of non-starchy vegetables + 1/4 plate of lean protein + 1/4 plate of carbohydrates with lunch and dinner  Limit carbohydrate intake to 15-30 grams carbohydrate/snack  Snacks = a source of carbohydrates + protein + fat  Add lean protein foods to meals/snacks  Ask about monitoring glucose levels with your doctor

## 2021-02-17 NOTE — Progress Notes (Signed)
Diabetes Self-Management Education  Visit Type:  First/Initial  Appt. Start Time: 2:05 Appt. End Time: 3:10  02/17/2021  [name], identified by name and date of birth, is a 60 y.o. female with a diagnosis of Diabetes: Type 2.   ASSESSMENT  States she and husband do not cook much. States she doesn't know what to eat by having heart challenges and diabetes. States it has been this way since diabetes diagnosis 11/2020. States she had a good appetite prior to recent diagnosis. States she has been trying to watch sugar, sodium, and fat intake. States she craves sweets and chocolate.Reports she has been reading nutrition facts labels. States when she doesn't know what to eat, she'll decide not to eat. Reports she makes smoothies at times that include almond milk, greek yogurt (2 g of sugar), natural peanut butter, blueberries, oatmeal, and 1 Tbs dark cacao. States sometimes will have smoothie as lunch option.   Pt expectations: wants to know what to eat  There were no vitals taken for this visit. There is no height or weight on file to calculate BMI.    Diabetes Self-Management Education - 02/17/21 1410      Health Coping   How would you rate your overall health? Good      Psychosocial Assessment   Patient Belief/Attitude about Diabetes Motivated to manage diabetes    Self-care barriers None    Self-management support Friends    Patient Concerns Nutrition/Meal planning    Special Needs None    Preferred Learning Style No preference indicated    Learning Readiness Ready      Complications   Last HgB A1C per patient/outside source 6.5 %    How often do you check your blood sugar? 0 times/day (not testing)    Number of hypoglycemic episodes per month 0    Number of hyperglycemic episodes per week 0    Have you had a dilated eye exam in the past 12 months? Yes    Have you had a dental exam in the past 12 months? Yes    Are you checking your feet? Yes    How many days per week are  you checking your feet? 2      Dietary Intake   Breakfast wheaties cereal + unsweetened almond milk    Lunch 2 multigrain crackers + mozzarella cheese    Snack (afternoon) a few blue tortilla chips    Dinner skipped    Beverage(s) water (1-1.5*20 oz; 20-30 oz), Starbucks double shot coffee (2*6.5 oz; 13 oz), unsweetened tea with stevia (rarely)      Exercise   Exercise Type ADL's    How many days per week to you exercise? 0    How many minutes per day do you exercise? 0    Total minutes per week of exercise 0      Patient Education   Previous Diabetes Education No    Disease state  Factors that contribute to the development of diabetes;Definition of diabetes, type 1 and 2, and the diagnosis of diabetes    Nutrition management  Role of diet in the treatment of diabetes and the relationship between the three main macronutrients and blood glucose level;Food label reading, portion sizes and measuring food.;Carbohydrate counting;Effects of alcohol on blood glucose and safety factors with consumption of alcohol.;Information on hints to eating out and maintain blood glucose control.    Physical activity and exercise  Role of exercise on diabetes management, blood pressure control and cardiac health.  Monitoring Purpose and frequency of SMBG.;Identified appropriate SMBG and/or A1C goals.    Acute complications Taught treatment of hypoglycemia - the 15 rule.;Discussed and identified patients' treatment of hyperglycemia.    Chronic complications Lipid levels, blood glucose control and heart disease;Applicable immunizations;Reviewed with patient heart disease, higher risk of, and prevention    Psychosocial adjustment Role of stress on diabetes      Individualized Goals (developed by patient)   Nutrition General guidelines for healthy choices and portions discussed    Physical Activity Exercise 1-2 times per week;15 minutes per day    Medications Not Applicable    Reducing Risk increase portions  of nuts and seeds;treat hypoglycemia with 15 grams of carbs if blood glucose less than 70mg /dL;increase portions of healthy fats      Post-Education Assessment   Patient understands the diabetes disease and treatment process. Demonstrates understanding / competency    Patient understands incorporating nutritional management into lifestyle. Demonstrates understanding / competency    Patient undertands incorporating physical activity into lifestyle. Demonstrates understanding / competency    Patient understands using medications safely. Needs Review    Patient understands monitoring blood glucose, interpreting and using results Demonstrates understanding / competency    Patient understands prevention, detection, and treatment of acute complications. Demonstrates understanding / competency    Patient understands prevention, detection, and treatment of chronic complications. Needs Review    Patient understands how to develop strategies to address psychosocial issues. Demonstrates understanding / competency    Patient understands how to develop strategies to promote health/change behavior. Demonstrates understanding / competency      Outcomes   Program Status Not Completed           Learning Objective:  Patient will have a greater understanding of diabetes self-management. Patient education plan is to attend individual and/or group sessions per assessed needs and concerns.   Plan:   Patient Instructions  Goals:  Follow Diabetes Meal Plan as instructed  Eat 3 meals and 2 snacks, every 3-5 hrs  Limit carbohydrate intake to 30-45 grams carbohydrate/meal  Meals = 1/2 plate of non-starchy vegetables + 1/4 plate of lean protein + 1/4 plate of carbohydrates with lunch and dinner  Limit carbohydrate intake to 15-30 grams carbohydrate/snack  Snacks = a source of carbohydrates + protein + fat  Add lean protein foods to meals/snacks  Ask about monitoring glucose levels with your  doctor     Expected Outcomes:  Demonstrated interest in learning. Expect positive outcomes  Education material provided: ADA - How to Thrive: A Guide for Your Journey with Diabetes  If problems or questions, patient to contact team via:  Phone and Email  Future DSME appointment: - 2 months

## 2021-02-18 ENCOUNTER — Ambulatory Visit: Payer: Self-pay | Admitting: Internal Medicine

## 2021-02-18 ENCOUNTER — Other Ambulatory Visit: Payer: Self-pay | Admitting: Internal Medicine

## 2021-02-18 DIAGNOSIS — E119 Type 2 diabetes mellitus without complications: Secondary | ICD-10-CM

## 2021-02-18 DIAGNOSIS — E785 Hyperlipidemia, unspecified: Secondary | ICD-10-CM

## 2021-02-22 ENCOUNTER — Other Ambulatory Visit: Payer: 59

## 2021-02-22 ENCOUNTER — Ambulatory Visit: Payer: 59

## 2021-02-23 LAB — LIPID PANEL
Cholesterol: 140 mg/dL (ref <200)
HDL: 71 mg/dL (ref >=50)
LDL Cholesterol (Calc): 51 mg/dL (calc)
Non-HDL Cholesterol (Calc): 69 mg/dL (calc) (ref <130)
Total CHOL/HDL Ratio: 2 (calc) (ref <5.0)
Triglycerides: 96 mg/dL (ref <150)

## 2021-02-23 LAB — HEMOGLOBIN A1C
Hgb A1c MFr Bld: 6.2 % of total Hgb — ABNORMAL HIGH (ref <5.7)
Mean Plasma Glucose: 131 mg/dL
eAG (mmol/L): 7.3 mmol/L

## 2021-02-23 LAB — MICROALBUMIN / CREATININE URINE RATIO
Creatinine, Urine: 298 mg/dL — ABNORMAL HIGH (ref 20–275)
Microalb Creat Ratio: 7 mcg/mg creat (ref <30)
Microalb, Ur: 2.2 mg/dL

## 2021-02-25 ENCOUNTER — Ambulatory Visit (INDEPENDENT_AMBULATORY_CARE_PROVIDER_SITE_OTHER): Payer: 59 | Admitting: Internal Medicine

## 2021-02-25 ENCOUNTER — Encounter: Payer: Self-pay | Admitting: Internal Medicine

## 2021-02-25 ENCOUNTER — Other Ambulatory Visit: Payer: Self-pay

## 2021-02-25 DIAGNOSIS — E119 Type 2 diabetes mellitus without complications: Secondary | ICD-10-CM | POA: Diagnosis not present

## 2021-02-25 DIAGNOSIS — I214 Non-ST elevation (NSTEMI) myocardial infarction: Secondary | ICD-10-CM

## 2021-02-25 DIAGNOSIS — I2510 Atherosclerotic heart disease of native coronary artery without angina pectoris: Secondary | ICD-10-CM

## 2021-02-25 DIAGNOSIS — Z95820 Peripheral vascular angioplasty status with implants and grafts: Secondary | ICD-10-CM | POA: Diagnosis not present

## 2021-02-25 DIAGNOSIS — E785 Hyperlipidemia, unspecified: Secondary | ICD-10-CM

## 2021-02-25 MED ORDER — FREESTYLE LANCETS MISC: 12 refills | Status: AC

## 2021-02-25 MED ORDER — FREESTYLE TEST VI STRP: ORAL_STRIP | 12 refills | Status: AC

## 2021-02-25 MED ORDER — FREESTYLE LITE W/DEVICE KIT: 1.0000 | PACK | Freq: Every day | 0 refills | Status: AC

## 2021-02-25 NOTE — Patient Instructions (Signed)

## 2021-02-25 NOTE — Assessment & Plan Note (Signed)
A1C improved Negative urine microalbumin Encouraged her to consume a low carb diet and exercise for weight loss Will send in a meter, strips and lancets to test once daily Encouraged routine eye exams Encouraged routine foot exams Flu shot UTD Pneumovax UTD Encouraged her to get her Covid booster

## 2021-02-25 NOTE — Assessment & Plan Note (Signed)
Continue Atorvastatin Encouraged her to consume a low fat diet

## 2021-02-25 NOTE — Assessment & Plan Note (Signed)
Lipid profile reviewed Continue Atorvastatin, ASA and Metoprolol She will continue to see cardiology, will follow

## 2021-02-25 NOTE — Progress Notes (Signed)
Subjective:    Patient ID: Brenda Chavez, female    DOB: [birth year], 60 y.o.   MRN: [id number]  HPI  Pt presents to the clinic today for follow up of chronic conditions.  HLD with CAD s/p MI with Stents: Her last LDL was 51, triglycerides 96, 01/2021. She denies myalgias on Atorvastatin. She is taking ASA and Metoprolol as well. She has Nitro to take if needed. She follows with cardiology.  DM 2: Her last A1C was 6.2%, 01/2021. She is not taking any oral diabetic medication at this time. She is not checking her sugars. She checks her feet routinely. Flu 07/2020. Pneumovax 11/2020. Covid- Pfizer x 2.  GERD: She is not sure what triggers this. She reports breakthrough on Omeprazole with good relief of symptoms. There is no upper GI on file.  Review of Systems      Past Medical History:  Diagnosis Date  . Abnormal Pap smear of cervix   . Anxiety   . CAD (coronary artery disease) cardiologist--- dr Angelena Form   a. 04/2015 NSTEMI/PCI: LM nl, LAD 30d, D1 100 (2.25x12 Promus Premier DES), D2 small, RI small, LCX nl, OM1/2 nl, RCA/PDA nl, EF 45-50%.  . Diabetes mellitus type 2, diet-controlled ([place])    followed by pcp--- newly dx 03/ 2022  . Family history of adverse reaction to anesthesia    mother-- ponv  . GERD (gastroesophageal reflux disease)   . Glaucoma, both eyes   . History of non-ST elevation myocardial infarction (NSTEMI) 05/14/2015   s/p  PCI and DES  . Hyperlipidemia   . Osteoporosis   . S/P drug eluting coronary stent placement 05/14/2015   DES x1 to D1  . Wears glasses     Current Outpatient Medications  Medication Sig Dispense Refill  . acetaminophen (TYLENOL) 325 MG tablet Take 2 tablets (650 mg total) by mouth every 4 (four) hours as needed for headache or mild pain.    . Ascorbic Acid (VITAMIN C) 1000 MG tablet Take 1,000 mg by mouth daily.    Marland Kitchen aspirin EC 81 MG tablet Take 1 tablet (81 mg total) by mouth daily. 90 tablet 3  . atorvastatin (LIPITOR) 40 MG tablet Take 1  tablet (40 mg total) by mouth daily. Please keep upcoming appt for further refills 90 tablet 3  . cholecalciferol (VITAMIN D) 1000 UNITS tablet Take 1,000 Units by mouth daily.    Marland Kitchen CINNAMON PO Take 1 capsule by mouth 2 (two) times daily.    . citalopram (CELEXA) 40 MG tablet TAKE 1 TABLET BY MOUTH DAILY. MUST SCHEDULE ANNUAL EXAM FOR MORE REFILLS 30 tablet 1  . esomeprazole (NEXIUM) 20 MG capsule Take 20 mg by mouth daily.    . Ginkgo Biloba (GINKOBA PO) Take 500 mg by mouth 2 (two) times daily.    Marland Kitchen latanoprost (XALATAN) 0.005 % ophthalmic solution Place 1 drop into both eyes at bedtime.    Marland Kitchen MAGNESIUM CITRATE PO Take 250 mg by mouth daily.    . metoprolol tartrate (LOPRESSOR) 25 MG tablet Take 0.5 tablets (12.5 mg total) by mouth 2 (two) times daily. 90 tablet 3  . nitroGLYCERIN (NITROSTAT) 0.4 MG SL tablet Place 1 tablet (0.4 mg total) under the tongue every 5 (five) minutes x 3 doses as needed for chest pain. 25 tablet 6   No current facility-administered medications for this visit.    Allergies  Allergen Reactions  . Erythromycin Nausea And Vomiting  . Floxin [Ofloxacin] Other (See Comments)  Unable to close eyes  . Macrobid [Nitrofurantoin Macrocrystal] Nausea And Vomiting    Family History  Problem Relation Age of Onset  . Breast cancer Cousin 7  . Heart disease Mother   . Heart disease Father   . COPD Father   . Heart attack Maternal Grandmother   . Heart attack Paternal Grandmother   . Breast cancer Maternal Aunt 35  . Diabetes Maternal Aunt   . Heart disease Paternal Aunt   . Colon cancer Cousin   . Breast cancer Cousin   . Cancer Other   . Hypertension Other   . Ovarian cancer Neg Hx   . Pancreatic cancer Neg Hx   . Endometrial cancer Neg Hx     Social History   Socioeconomic History  . Marital status: Married    Spouse name: Not on file  . Number of children: Not on file  . Years of education: Not on file  . Highest education level: Not on file   Occupational History  . Not on file  Tobacco Use  . Smoking status: Never Smoker  . Smokeless tobacco: Never Used  Vaping Use  . Vaping Use: Never used  Substance and Sexual Activity  . Alcohol use: No  . Drug use: Never  . Sexual activity: Yes    Birth control/protection: Post-menopausal  Other Topics Concern  . Not on file  Social History Narrative  . Not on file   Social Determinants of Health   Financial Resource Strain: Not on file  Food Insecurity: Not on file  Transportation Needs: Not on file  Physical Activity: Not on file  Stress: Not on file  Social Connections: Not on file  Intimate Partner Violence: Not on file     Constitutional: Denies fever, malaise, fatigue, headache or abrupt weight changes.  HEENT: Denies eye pain, eye redness, ear pain, ringing in the ears, wax buildup, runny nose, nasal congestion, bloody nose, or sore throat. Respiratory: Denies difficulty breathing, shortness of breath, cough or sputum production.   Cardiovascular: Denies chest pain, chest tightness, palpitations or swelling in the hands or feet.  Gastrointestinal: Denies abdominal pain, bloating, constipation, diarrhea or blood in the stool.  Skin: Denies redness, rashes, lesions or ulcercations.  Neurological: Denies dizziness, difficulty with memory, difficulty with speech or problems with balance and coordination.    No other specific complaints in a complete review of systems (except as listed in HPI above).  Objective:   Physical Exam  BP 120/71 (BP Location: Left Arm, Patient Position: Sitting, Cuff Size: Normal)   Pulse (!) 56   Temp (!) 97.1 F (36.2 C) (Temporal)   Resp 17   Ht 5\' 4"  (1.626 m)   Wt 159 lb 12.8 oz (72.5 kg)   SpO2 98%   BMI 27.43 kg/m   Wt Readings from Last 3 Encounters:  02/14/21 154 lb 3.2 oz (69.9 kg)  02/09/21 152 lb 12.8 oz (69.3 kg)  02/07/21 150 lb 2 oz (68.1 kg)    General: Appears her stated age, well developed, well nourished in  NAD. Skin: Warm, dry and intact. No ulcerations noted. HEENT: Head: normal shape and size; Eyes: sclera white and EOMs intact;  Cardiovascular: Bradycardic with normal rhythm. S1,S2 noted.  No murmur, rubs or gallops noted. No JVD or BLE edema. No carotid bruits noted. Pulmonary/Chest: Normal effort and positive vesicular breath sounds. No respiratory distress. No wheezes, rales or ronchi noted.  Musculoskeletal: No difficulty with gait.  Neurological: Alert and oriented.  BMET    Component Value Date/Time   NA 140 02/07/2021 0958   K 4.5 02/07/2021 0958   CL 108 02/07/2021 0958   CO2 27 02/07/2021 0958   GLUCOSE 119 (H) 02/07/2021 0958   BUN 23 (H) 02/07/2021 0958   CREATININE 0.85 02/07/2021 0958   CALCIUM 9.7 02/07/2021 0958   GFRNONAA >60 02/07/2021 0958   GFRAA >60 05/15/2015 0259    Lipid Panel     Component Value Date/Time   CHOL 140 02/22/2021 0824   CHOL 127 01/28/2020 0947   TRIG 96 02/22/2021 0824   HDL 71 02/22/2021 0824   HDL 58 01/28/2020 0947   CHOLHDL 2.0 02/22/2021 0824   VLDL 16 07/07/2015 0813   LDLCALC 51 02/22/2021 0824    CBC    Component Value Date/Time   WBC 6.1 02/07/2021 0958   RBC 4.81 02/07/2021 0958   HGB 14.4 02/07/2021 0958   HCT 45.7 02/07/2021 0958   PLT 261 02/07/2021 0958   MCV 95.0 02/07/2021 0958   MCH 29.9 02/07/2021 0958   MCHC 31.5 02/07/2021 0958   RDW 13.3 02/07/2021 0958   LYMPHSABS 2.4 05/14/2015 0221   MONOABS 0.7 05/14/2015 0221   EOSABS 0.1 05/14/2015 0221   BASOSABS 0.0 05/14/2015 0221    Hgb A1C Lab Results  Component Value Date   HGBA1C 6.2 (H) 02/22/2021           Assessment & Plan:    Webb Silversmith, NP This visit occurred during the SARS-CoV-2 public health emergency.  Safety protocols were in place, including screening questions prior to the visit, additional usage of staff PPE, and extensive cleaning of exam room while observing appropriate contact time as indicated for disinfecting solutions.

## 2021-03-08 ENCOUNTER — Encounter: Payer: Self-pay | Admitting: Gynecologic Oncology

## 2021-03-09 ENCOUNTER — Inpatient Hospital Stay: Payer: 59 | Attending: Gynecologic Oncology | Admitting: Gynecologic Oncology

## 2021-03-09 ENCOUNTER — Other Ambulatory Visit: Payer: Self-pay

## 2021-03-09 ENCOUNTER — Encounter: Payer: Self-pay | Admitting: Gynecologic Oncology

## 2021-03-09 VITALS — BP 148/95 | HR 53 | Temp 97.4°F | Resp 18 | Wt 147.2 lb

## 2021-03-09 DIAGNOSIS — N840 Polyp of corpus uteri: Secondary | ICD-10-CM

## 2021-03-09 DIAGNOSIS — R87619 Unspecified abnormal cytological findings in specimens from cervix uteri: Secondary | ICD-10-CM

## 2021-03-09 DIAGNOSIS — Z9889 Other specified postprocedural states: Secondary | ICD-10-CM | POA: Insufficient documentation

## 2021-03-09 NOTE — Patient Instructions (Signed)
It was good to see you today!  I am glad you have healed so well from surgery.  Things on your exam appear to be healing well.    As we discussed, I am good to recommend to your gynecologist that you have an appointment with a Pap smear in a year.  If this comes back normal, you can continue with routine screening.

## 2021-03-09 NOTE — Progress Notes (Signed)
Gynecologic Oncology Return Clinic Visit  03/09/2021  Reason for Visit: Postoperative follow-up  Treatment History: Colposcopy performed on 01/10/2021 with a cervical biopsy showing benign squamous mucosa, no endocervical mucosa present.  Endocervical curettage showing atypical squamous cells, most of which are described as degenerating squamous cells, few of which are atypical.  No definitive dysplasia seen. Ultrasound 01/10/2021 showed a uterus measuring 5.8 x 1.6 x 3 cm with an endometrial lining of 6.2 mm.,  Simple cystic areas seen within the endometrial lining.  Ovaries normal in appearance and size.  No free fluid. Pap test performed 11/18/2020 showed atypical endocervical glandular cells, mostly inactive degenerating squamous, bacterial morphology consistent with actinomyces species.  High risk HPV not detected. Endometrial biopsy 11/20/2019 showed an endometrioid type polyp, no malignancy or hyperplasia. Colposcopy was performed in January 2021 with cervical biopsy showing squamous atypia consistent with koilocytic effect.  No glandular mucosa identified.  No malignancy seen. Pap test in January 2021 showed atypical glandular cells.  High risk HPV not detected.  Interval History: Patient presents today for follow-up after surgery in mid May.  On 5/18 she underwent hysteroscopy with hysteroscopic polypectomy, cold knife cone, and ECC.  Pathology revealed benign endometrial polyp, no cervical dysplasia or malignancy and benign endocervical curettage.  She has overall done very well after surgery.  She denies any vaginal bleeding or discharge after the first couple of days.  She denies any pelvic pain or cramping.  She reports a good appetite without nausea or emesis.  She endorses normal bowel and bladder function.  Past Medical/Surgical History: Past Medical History:  Diagnosis Date   Abnormal Pap smear of cervix    Anxiety    CAD (coronary artery disease) cardiologist--- dr Angelena Form   a.  04/2015 NSTEMI/PCI: LM nl, LAD 30d, D1 100 (2.25x12 Promus Premier DES), D2 small, RI small, LCX nl, OM1/2 nl, RCA/PDA nl, EF 45-50%.   Diabetes mellitus type 2, diet-controlled ([place])    followed by pcp--- newly dx 03/ 2022   Family history of adverse reaction to anesthesia    mother-- ponv   GERD (gastroesophageal reflux disease)    Glaucoma, both eyes    History of non-ST elevation myocardial infarction (NSTEMI) 05/14/2015   s/p  PCI and DES   Hyperlipidemia    Osteoporosis    S/P drug eluting coronary stent placement 05/14/2015   DES x1 to D1   Wears glasses     Past Surgical History:  Procedure Laterality Date   CARDIAC CATHETERIZATION N/A 05/14/2015   Procedure: Left Heart Cath and Coronary Angiography;  Surgeon: Burnell Blanks, MD;  Location: [facility];  Service: Cardiovascular;  Laterality: N/A;   CARDIAC CATHETERIZATION N/A 05/14/2015   Procedure: Coronary Stent Intervention;  Surgeon: Burnell Blanks, MD;  Location: [facility];  Service: Cardiovascular;  Laterality: N/A;   CERVICAL CONIZATION W/BX N/A 02/09/2021   Procedure: COLD KNIFE CONIZATION CERVIX;  Surgeon: Lafonda Mosses, MD;  Location: [facility];  Service: Gynecology;  Laterality: N/A;   DILATION AND CURETTAGE OF UTERUS  2021   HYSTEROSCOPY WITH D & C N/A 02/09/2021   Procedure: DILATATION AND CURETTAGE /HYSTEROSCOPY WITH MYOSURE, POLYPECTOMY;  Surgeon: Lafonda Mosses, MD;  Location: [facility];  Service: Gynecology;  Laterality: N/A;   WISDOM TOOTH EXTRACTION  1984    Family History  Problem Relation Age of Onset   Breast cancer Cousin 30   Heart disease Mother    Heart disease Father  COPD Father    Heart attack Maternal Grandmother    Heart attack Paternal Grandmother    Breast cancer Maternal Aunt 45   Diabetes Maternal Aunt    Heart disease Paternal Aunt    Colon cancer Cousin    Breast cancer Cousin    Cancer Other     Hypertension Other    Ovarian cancer Neg Hx    Pancreatic cancer Neg Hx    Endometrial cancer Neg Hx     Social History   Socioeconomic History   Marital status: Married    Spouse name: Not on file   Number of children: Not on file   Years of education: Not on file   Highest education level: Not on file  Occupational History   Not on file  Tobacco Use   Smoking status: Never   Smokeless tobacco: Never  Vaping Use   Vaping Use: Never used  Substance and Sexual Activity   Alcohol use: No   Drug use: Never   Sexual activity: Yes    Birth control/protection: Post-menopausal  Other Topics Concern   Not on file  Social History Narrative   Not on file   Social Determinants of Health   Financial Resource Strain: Not on file  Food Insecurity: Not on file  Transportation Needs: Not on file  Physical Activity: Not on file  Stress: Not on file  Social Connections: Not on file    Current Medications:  Current Outpatient Medications:    Ascorbic Acid (VITAMIN C) 1000 MG tablet, Take 1,000 mg by mouth daily., Disp: , Rfl:    aspirin EC 81 MG tablet, Take 1 tablet (81 mg total) by mouth daily., Disp: 90 tablet, Rfl: 3   atorvastatin (LIPITOR) 40 MG tablet, Take 1 tablet (40 mg total) by mouth daily. Please keep upcoming appt for further refills, Disp: 90 tablet, Rfl: 3   cholecalciferol (VITAMIN D) 1000 UNITS tablet, Take 1,000 Units by mouth daily., Disp: , Rfl:    CINNAMON PO, Take 1 capsule by mouth 2 (two) times daily., Disp: , Rfl:    citalopram (CELEXA) 40 MG tablet, TAKE 1 TABLET BY MOUTH DAILY. MUST SCHEDULE ANNUAL EXAM FOR MORE REFILLS, Disp: 30 tablet, Rfl: 1   esomeprazole (NEXIUM) 20 MG capsule, Take 20 mg by mouth daily., Disp: , Rfl:    Ginkgo Biloba (GINKOBA PO), Take 500 mg by mouth 2 (two) times daily., Disp: , Rfl:    latanoprost (XALATAN) 0.005 % ophthalmic solution, Place 1 drop into both eyes at bedtime., Disp: , Rfl:    MAGNESIUM CITRATE PO, Take 250 mg by  mouth daily., Disp: , Rfl:    metoprolol tartrate (LOPRESSOR) 25 MG tablet, Take 0.5 tablets (12.5 mg total) by mouth 2 (two) times daily., Disp: 90 tablet, Rfl: 3   Blood Glucose Monitoring Suppl (FREESTYLE LITE) w/Device KIT, 1 Device by Does not apply route daily. (Patient not taking: Reported on 03/08/2021), Disp: 1 kit, Rfl: 0   glucose blood (FREESTYLE TEST STRIPS) test strip, Use as instructed (Patient not taking: Reported on 03/08/2021), Disp: 100 each, Rfl: 12   Lancets (FREESTYLE) lancets, Use as instructed (Patient not taking: Reported on 03/08/2021), Disp: 100 each, Rfl: 12   nitroGLYCERIN (NITROSTAT) 0.4 MG SL tablet, Place 1 tablet (0.4 mg total) under the tongue every 5 (five) minutes x 3 doses as needed for chest pain. (Patient not taking: Reported on 03/08/2021), Disp: 25 tablet, Rfl: 6  Review of Systems: Denies appetite changes, fevers, chills, fatigue,  unexplained weight changes. Denies hearing loss, neck lumps or masses, mouth sores, ringing in ears or voice changes. Denies cough or wheezing.  Denies shortness of breath. Denies chest pain or palpitations. Denies leg swelling. Denies abdominal distention, pain, blood in stools, constipation, diarrhea, nausea, vomiting, or early satiety. Denies pain with intercourse, dysuria, frequency, hematuria or incontinence. Denies hot flashes, pelvic pain, vaginal bleeding or vaginal discharge.   Denies joint pain, back pain or muscle pain/cramps. Denies itching, rash, or wounds. Denies dizziness, headaches, numbness or seizures. Denies swollen lymph nodes or glands, denies easy bruising or bleeding. Denies anxiety, depression, confusion, or decreased concentration.  Physical Exam: BP (!) 148/95   Pulse (!) 53   Temp (!) 97.4 F (36.3 C)   Resp 18   Wt 147 lb 3.2 oz (66.8 kg)   SpO2 99%   BMI 25.27 kg/m  General: Alert, oriented, no acute distress. HEENT: Normocephalic, atraumatic, sclera anicteric. Chest: Unlabored breathing on  room air. Extremities: Grossly normal range of motion.  Warm, well perfused.  No edema bilaterally. Skin: No rashes or lesions noted. GU: Normal appearing external genitalia without erythema, excoriation, or lesions.  Speculum exam reveals cone bed healing well, some suture still visible at right aspect of the cervix.  Minimal bleeding on speculum exam.  Bimanual exam reveals no significant tenderness.    Laboratory & Radiologic Studies: A. ENDOMETRIAL POLYP:  - Benign endometrial polyp  - Negative for hyperplasia or malignancy   B. CERVIX, CONIZATION:  - Benign cervical squamous and glandular mucosa  - Negative for dysplasia   C. ENDOCERVICAL CURETTINGS:  - Predominantly blood and fibrin  - Minute strips of glandular epithelium   Assessment & Plan: Brenda Chavez is a 60 y.o. woman who is approximately 1 month status post hysteroscopy with resection of endometrial polyp, cold knife cone, and endocervical curettage with all benign pathology.  The patient is healing very well from surgery.  We discussed continued postoperative expectations and restrictions.  I took pictures during her hysteroscopy which we reviewed today.  Patient was given a copy of these for her own records as well as a copy of her pathology report.  I suspect that her most recent Pap smear which showed atypical glandular cells may have been related to her endometrial polyp.  Her endometrial cavity was very atrophic without any focal lesions other than the polyp.  All tissue sampling performed at her recent surgery was negative for any dysplasia, hyperplasia, or malignancy.  My recommendation moving forward is that she continued to have regular visits with her gynecologist.  I recommend a follow-up Pap test in 1 year.  As long as this is normal, I think she can move to routine surveillance.  She and I discussed that if she were to have any vaginal bleeding, discharge, pelvic pain, or other symptoms, she should call her  gynecologist to schedule a visit sooner.  28 minutes of total time was spent for this patient encounter, including preparation, face-to-face counseling with the patient and coordination of care, and documentation of the encounter.  Jeral Pinch, MD  Division of Gynecologic Oncology  Department of Obstetrics and Gynecology  [facility]

## 2021-03-16 ENCOUNTER — Other Ambulatory Visit: Payer: Self-pay | Admitting: *Deleted

## 2021-03-16 MED ORDER — METOPROLOL TARTRATE 25 MG PO TABS: 12.5000 mg | ORAL_TABLET | Freq: Two times a day (BID) | ORAL | 3 refills | Status: DC

## 2021-04-06 ENCOUNTER — Ambulatory Visit: Payer: 59 | Admitting: Registered"

## 2021-04-18 ENCOUNTER — Ambulatory Visit: Payer: Self-pay | Admitting: *Deleted

## 2021-04-18 NOTE — Telephone Encounter (Signed)
Patient reports she has been exposed to [place] on vacation 7/21- no symptoms at this time. Patient advised per exposure protocol- when to test, wear mask, watch for symptoms. Patient advised please contact office if she test + COVID.

## 2021-04-18 NOTE — Telephone Encounter (Signed)
Pt is calling and has been exposure to covid on 04-14-2021. Pt is not having any symptoms and has dm and heart problem. Please advise  Reason for Disposition  [1] CLOSE CONTACT COVID-19 EXPOSURE within last 14 days AND [2] NO symptoms  Answer Assessment - Initial Assessment Questions 1. COVID-19 EXPOSURE: "Please describe how you were exposed to someone with a COVID-19 infection."     Patient close contact- vacation 2. PLACE of CONTACT: "Where were you when you were exposed to COVID-19?" (e.g., home, school, medical waiting room; which city?)     In the home- vacation 3. TYPE of CONTACT: "How much contact was there?" (e.g., sitting next to, live in same house, work in same office, same building)     Same house 4. DURATION of CONTACT: "How long were you in contact with the COVID-19 patient?" (e.g., a few seconds, passed by person, a few minutes, 15 minutes or longer, live with the patient)     Vacation- same house 5. MASK: "Were you wearing a mask?" "Was the other person wearing a mask?" Note: wearing a mask reduces the risk of an otherwise close contact.     no 6. DATE of CONTACT: "When did you have contact with a COVID-19 patient?" (e.g., how many days ago)     7/21 7. COMMUNITY SPREAD: "Are there lots of cases of COVID-19 (community spread) where you live?" (See public health department website, if unsure)       yes 8. SYMPTOMS: "Do you have any symptoms?" (e.g., fever, cough, breathing difficulty, loss of taste or smell)     No symptoms 9. VACCINE: "Have you gotten the COVID-19 vaccine?" If Yes, ask: "Which one, how many shots, when did you get it?"     COVID series- pfizer  10. BOOSTER: "Have you received your COVID-19 booster?" If Yes, ask: "Which one and when did you get it?"       no 11. PREGNANCY OR POSTPARTUM: "Is there any chance you are pregnant?" "When was your last menstrual period?" "Did you deliver in the last 2 weeks?"       N/a 12. HIGH RISK: "Do you have any heart or lung  problems?" (e.g., asthma , COPD, heart failure) "Do you have a weak immune system or other risk factors?" (e.g., HIV positive, chemotherapy, renal failure, diabetes mellitus, sickle cell anemia, obesity)       Diabetes, heart disease 13. TRAVEL: "Have you traveled out of the country recently?" If Yes, ask: "When and where?"  Note: Travel becomes less relevant if there is widespread community transmission where the patient lives.       yes  Protocols used: Coronavirus ([id number]) Exposure-A-AH

## 2021-04-21 ENCOUNTER — Other Ambulatory Visit: Payer: Self-pay

## 2021-04-21 ENCOUNTER — Encounter: Payer: Self-pay | Admitting: Internal Medicine

## 2021-04-21 ENCOUNTER — Telehealth (INDEPENDENT_AMBULATORY_CARE_PROVIDER_SITE_OTHER): Payer: 59 | Admitting: Internal Medicine

## 2021-04-21 DIAGNOSIS — U071 COVID-19: Secondary | ICD-10-CM

## 2021-04-21 MED ORDER — BENZONATATE 200 MG PO CAPS: 200.0000 mg | ORAL_CAPSULE | Freq: Three times a day (TID) | ORAL | 0 refills | Status: DC | PRN

## 2021-04-21 MED ORDER — NIRMATRELVIR/RITONAVIR (PAXLOVID)TABLET: 3.0000 | ORAL_TABLET | Freq: Two times a day (BID) | ORAL | 0 refills | Status: AC

## 2021-04-21 NOTE — Progress Notes (Signed)
Virtual Visit via Video Note  I connected with Brenda Chavez on 04/21/21 at  1:20 PM EDT by a video enabled telemedicine application and verified that I am speaking with the correct person using two identifiers.  Location: Patient: Home Provider: Office  Person's participating in this video call: Webb Silversmith NP and Jacqualyn Posey.   I discussed the limitations of evaluation and management by telemedicine and the availability of in person appointments. The patient expressed understanding and agreed to proceed.  History of Present Illness:  Pt reports headache,runny nose, sore throat, cough, fever, chills and body aches. She reports this started 4 days ago. The headache has resolved. She is blowing clear mucous out of her nose. She denies difficulty swallowing. The cough is non productive. She denies ear pain, nasal congestion, shortness of breath, chest pain, vomiting or diarrhea. She has had some nausea. She reports she had a positive Covid test yesterday. She has not been taking anything OTC for her symptoms.   Past Medical History:  Diagnosis Date   Abnormal Pap smear of cervix    Anxiety    CAD (coronary artery disease) cardiologist--- dr Angelena Form   a. 04/2015 NSTEMI/PCI: LM nl, LAD 30d, D1 100 (2.25x12 Promus Premier DES), D2 small, RI small, LCX nl, OM1/2 nl, RCA/PDA nl, EF 45-50%.   Diabetes mellitus type 2, diet-controlled ([place])    followed by pcp--- newly dx 03/ 2022   Family history of adverse reaction to anesthesia    mother-- ponv   GERD (gastroesophageal reflux disease)    Glaucoma, both eyes    History of non-ST elevation myocardial infarction (NSTEMI) 05/14/2015   s/p  PCI and DES   Hyperlipidemia    Osteoporosis    S/P drug eluting coronary stent placement 05/14/2015   DES x1 to D1   Wears glasses     Current Outpatient Medications  Medication Sig Dispense Refill   Ascorbic Acid (VITAMIN C) 1000 MG tablet Take 1,000 mg by mouth daily.     aspirin EC 81 MG tablet Take  1 tablet (81 mg total) by mouth daily. 90 tablet 3   atorvastatin (LIPITOR) 40 MG tablet Take 1 tablet (40 mg total) by mouth daily. Please keep upcoming appt for further refills 90 tablet 3   Blood Glucose Monitoring Suppl (FREESTYLE LITE) w/Device KIT 1 Device by Does not apply route daily. (Patient not taking: Reported on 03/08/2021) 1 kit 0   cholecalciferol (VITAMIN D) 1000 UNITS tablet Take 1,000 Units by mouth daily.     CINNAMON PO Take 1 capsule by mouth 2 (two) times daily.     citalopram (CELEXA) 40 MG tablet TAKE 1 TABLET BY MOUTH DAILY. MUST SCHEDULE ANNUAL EXAM FOR MORE REFILLS 30 tablet 1   esomeprazole (NEXIUM) 20 MG capsule Take 20 mg by mouth daily.     Ginkgo Biloba (GINKOBA PO) Take 500 mg by mouth 2 (two) times daily.     glucose blood (FREESTYLE TEST STRIPS) test strip Use as instructed (Patient not taking: Reported on 03/08/2021) 100 each 12   Lancets (FREESTYLE) lancets Use as instructed (Patient not taking: Reported on 03/08/2021) 100 each 12   latanoprost (XALATAN) 0.005 % ophthalmic solution Place 1 drop into both eyes at bedtime.     MAGNESIUM CITRATE PO Take 250 mg by mouth daily.     metoprolol tartrate (LOPRESSOR) 25 MG tablet Take 0.5 tablets (12.5 mg total) by mouth 2 (two) times daily. 90 tablet 3   nitroGLYCERIN (NITROSTAT) 0.4 MG  SL tablet Place 1 tablet (0.4 mg total) under the tongue every 5 (five) minutes x 3 doses as needed for chest pain. (Patient not taking: Reported on 03/08/2021) 25 tablet 6   No current facility-administered medications for this visit.    Allergies  Allergen Reactions   Erythromycin Nausea And Vomiting   Floxin [Ofloxacin] Other (See Comments)    Unable to close eyes   Macrobid [Nitrofurantoin Macrocrystal] Nausea And Vomiting   Other     Other reaction(s): Unknown Other reaction(s): Unknown    Family History  Problem Relation Age of Onset   Breast cancer Cousin 36   Heart disease Mother    Heart disease Father    COPD Father     Heart attack Maternal Grandmother    Heart attack Paternal Grandmother    Breast cancer Maternal Aunt 81   Diabetes Maternal Aunt    Heart disease Paternal Aunt    Colon cancer Cousin    Breast cancer Cousin    Cancer Other    Hypertension Other    Ovarian cancer Neg Hx    Pancreatic cancer Neg Hx    Endometrial cancer Neg Hx     Social History   Socioeconomic History   Marital status: Married    Spouse name: Not on file   Number of children: Not on file   Years of education: Not on file   Highest education level: Not on file  Occupational History   Not on file  Tobacco Use   Smoking status: Never   Smokeless tobacco: Never  Vaping Use   Vaping Use: Never used  Substance and Sexual Activity   Alcohol use: No   Drug use: Never   Sexual activity: Yes    Birth control/protection: Post-menopausal  Other Topics Concern   Not on file  Social History Narrative   Not on file   Social Determinants of Health   Financial Resource Strain: Not on file  Food Insecurity: Not on file  Transportation Needs: Not on file  Physical Activity: Not on file  Stress: Not on file  Social Connections: Not on file  Intimate Partner Violence: Not on file     Constitutional: Denies fever, malaise, fatigue, headache or abrupt weight changes.  HEENT: Pt reports runny nose, sore throat. Denies eye pain, eye redness, ear pain, ringing in the ears, wax buildup, nasal congestion, bloody nose Respiratory: Pt reports cough. Denies difficulty breathing, shortness of breath, or sputum production.   Cardiovascular: Denies chest pain, chest tightness, palpitations or swelling in the hands or feet.  Gastrointestinal: Pt reports nausea. Denies abdominal pain, bloating, constipation, diarrhea or blood in the stool.   No other specific complaints in a complete review of systems (except as listed in HPI above).    Observations/Objective:  Wt Readings from Last 3 Encounters:  03/09/21 147 lb 3.2  oz (66.8 kg)  02/25/21 159 lb 12.8 oz (72.5 kg)  02/14/21 154 lb 3.2 oz (69.9 kg)    General: Appears her stated age, ill appearing but in NAD. HEENT: Head: normal shape and size; Nose: runny nose noted ; Throat/Mouth: hoarseness noted Pulmonary/Chest: Normal effort. No respiratory distress.  Neurological: Alert and oriented.  BMET    Component Value Date/Time   NA 140 02/07/2021 0958   K 4.5 02/07/2021 0958   CL 108 02/07/2021 0958   CO2 27 02/07/2021 0958   GLUCOSE 119 (H) 02/07/2021 0958   BUN 23 (H) 02/07/2021 0958   CREATININE 0.85 02/07/2021 0175  CALCIUM 9.7 02/07/2021 0958   GFRNONAA >60 02/07/2021 0958   GFRAA >60 05/15/2015 0259    Lipid Panel     Component Value Date/Time   CHOL 140 02/22/2021 0824   CHOL 127 01/28/2020 0947   TRIG 96 02/22/2021 0824   HDL 71 02/22/2021 0824   HDL 58 01/28/2020 0947   CHOLHDL 2.0 02/22/2021 0824   VLDL 16 07/07/2015 0813   LDLCALC 51 02/22/2021 0824    CBC    Component Value Date/Time   WBC 6.1 02/07/2021 0958   RBC 4.81 02/07/2021 0958   HGB 14.4 02/07/2021 0958   HCT 45.7 02/07/2021 0958   PLT 261 02/07/2021 0958   MCV 95.0 02/07/2021 0958   MCH 29.9 02/07/2021 0958   MCHC 31.5 02/07/2021 0958   RDW 13.3 02/07/2021 0958   LYMPHSABS 2.4 05/14/2015 0221   MONOABS 0.7 05/14/2015 0221   EOSABS 0.1 05/14/2015 0221   BASOSABS 0.0 05/14/2015 0221    Hgb A1C Lab Results  Component Value Date   HGBA1C 6.2 (H) 02/22/2021       Assessment and Plan:  Covid 19:  Encouraged rest and fluids Encouraged Ibuprofen for fever and body aches Can use salt water gargles as needed RX for Paxlovid BID x 5 days- will need to hold Atorvastatin RX for Tessalon Perles 200 mg TID prn Discussed when to end quarantine  Return/ER precautions discussed   Follow Up Instructions:    I discussed the assessment and treatment plan with the patient. The patient was provided an opportunity to ask questions and all were answered.  The patient agreed with the plan and demonstrated an understanding of the instructions.   The patient was advised to call back or seek an in-person evaluation if the symptoms worsen or if the condition fails to improve as anticipated.   Webb Silversmith, NP

## 2021-04-21 NOTE — Patient Instructions (Signed)
10 Things You Can Do to Manage Your COVID-19 Symptoms at Home If you have possible or confirmed COVID-19 Stay home except to get medical care. Monitor your symptoms carefully. If your symptoms get worse, call your healthcare provider immediately. Get rest and stay hydrated. If you have a medical appointment, call the healthcare provider ahead of time and tell them that you have or may have COVID-19. For medical emergencies, call 911 and notify the dispatch personnel that you have or may have COVID-19. Cover your cough and sneezes with a tissue or use the inside of your elbow. Wash your hands often with soap and water for at least 20 seconds or clean your hands with an alcohol-based hand sanitizer that contains at least 60% alcohol. As much as possible, stay in a specific room and away from other people in your home. Also, you should use a separate bathroom, if available. If you need to be around other people in or outside of the home, wear a mask. Avoid sharing personal items with other people in your household, like dishes, towels, and bedding. Clean all surfaces that are touched often, like counters, tabletops, and doorknobs. Use household cleaning sprays or wipes according to the label instructions. cdc.gov/coronavirus 04/09/2020 This information is not intended to replace advice given to you by your health care provider. Make sure you discuss any questions you have with your healthcare provider. Document Revised: 10/29/2020 Document Reviewed: 10/29/2020 Elsevier Patient Education  2022 Elsevier Inc.  

## 2021-04-22 ENCOUNTER — Telehealth: Payer: 59 | Admitting: Internal Medicine

## 2021-04-29 ENCOUNTER — Ambulatory Visit: Payer: Self-pay | Admitting: *Deleted

## 2021-04-29 NOTE — Telephone Encounter (Signed)
I returned pt's call.   She had called in from [facility], [place] c/o a lingering sore throat post Covid.   She tested positive for Covid on 04/20/2021.   She did a virtual visit with Webb Silversmith, NP on 04/21/2021 and was prescribed Paxlovid.   She is feeling much better and her symptoms have gotten much better/resolved except for the sore throat.    "It is not going away".   "I'm having post nasal drip".   No runny nose but a lot of drainage down the back of her throat".     Her last dose of Paxlovid was Wed. Morning of this week.   She's wanting to know when she can resume taking the atorvastatin.   She had to stop it while taking the Paxlovid.  I let her know I would check with Rollene Fare and someone would call her back.  Pt requesting something for the sore throat.   Would like to do a virtual MyChart visit with Rollene Fare today if possible.    I also let her know about the MyChart virtual visit option available via MyChart with a provider if it did not work out where [place] could talk with her today.   She was agreeable to that option if [facility] wasn't available today.  I sent my notes to [facility] for Webb Silversmith, NP.

## 2021-04-29 NOTE — Telephone Encounter (Signed)
The pt was notified of the recommendation. She verbalize understanding, no questions or concern.

## 2021-04-29 NOTE — Telephone Encounter (Signed)
Reason for Disposition  [1] PERSISTING SYMPTOMS OF COVID-19 AND [2] symptoms WORSE    Sore throat remains post Covid  Answer Assessment - Initial Assessment Questions 1. COVID-19 ONSET: "When did the symptoms of COVID-19 first start?"     04/20/2021 and tested positive for Covid. 2. DIAGNOSIS CONFIRMATION: "How were you diagnosed?" (e.g., COVID-19 oral or nasal viral test; COVID-19 antibody test; doctor visit)     test 3. MAIN SYMPTOM:  "What is your main concern or symptom right now?" (e.g., breathing difficulty, cough, fatigue. loss of smell)     Sore throat is not going away.    All the other symptoms have resolved.   No    Post nasal drip all the time.  Nose not runny now.    4. SYMPTOM ONSET: "When did the  sore throat  start?"     It never went away 5. BETTER-SAME-WORSE: "Are you getting better, staying the same, or getting worse over the last 1 to 2 weeks?"     better 6. RECENT MEDICAL VISIT: "Have you been seen by a healthcare provider (doctor, NP, PA) for these persisting COVID-19 symptoms?" If Yes, ask: "When were you seen?" (e.g., date)     Yes I did a tele visit 04/21/2021 with Webb Silversmith, NP 7. COUGH: "Do you have a cough?" If Yes, ask: "How bad is the cough?"       Cough has mostly resolved. 8. FEVER: "Do you have a fever?" If Yes, ask: "What is your temperature, how was it measured, and when did it start?"     No 9. BREATHING DIFFICULTY: "Are you having any trouble breathing?" If Yes, ask: "How bad is your breathing?" (e.g., mild, moderate, severe)    - MILD: No SOB at rest, mild SOB with walking, speaks normally in sentences, can lie down, no retractions, pulse < 100.    - MODERATE: SOB at rest, SOB with minimal exertion and prefers to sit, cannot lie down flat, speaks in phrases, mild retractions, audible wheezing, pulse 100-120.    - SEVERE: Very SOB at rest, speaks in single words, struggling to breathe, sitting hunched forward, retractions, pulse > 120       Since I had  Covid I get short of breath with a lot of exertion. 10. HIGH RISK DISEASE: "Do you have any chronic medical problems?" (e.g., asthma, heart or lung disease, weak immune system, obesity, etc.)       CAD, and diabetes 11. VACCINE: "Have you gotten the COVID-19 vaccine?" If Yes, ask: "Which one, how many shots, when did you get it?"       Not 12. BOOSTER: "Have you received your COVID-19 booster?" If Yes, ask: "Which one and when did you get it?"       Not asked 13. PREGNANCY: "Is there any chance you are pregnant?" "When was your last menstrual period?"       N/A due to age 60. OTHER SYMPTOMS: "Do you have any other symptoms?"  (e.g., fatigue, headache, muscle pain, weakness)       Just the sore throat remains.    15. O2 SATURATION MONITOR:  "Do you use an oxygen saturation monitor (pulse oximeter) at home?" If Yes, ask "What is your reading (oxygen level) today?" "What is your usual oxygen saturation reading?" (e.g., 95%)       No  Protocols used: Coronavirus (COVID-19) Persisting Symptoms Follow-up Call-[facility]

## 2021-04-29 NOTE — Telephone Encounter (Signed)
Already spoke with pt.   Her call must have been entered in the nurse triage queue twice.

## 2021-04-29 NOTE — Telephone Encounter (Signed)
I can not add her on today. She can go ahead and resume Paxlovid. Recommend Zyrtec 10 mg daily in addition to Ibuprofen or Tylenol every 8 hours as needed. She can do an Evisit with another provider through [place]

## 2021-05-03 ENCOUNTER — Encounter: Payer: 59 | Admitting: Registered"

## 2021-05-09 ENCOUNTER — Other Ambulatory Visit: Payer: Self-pay | Admitting: Cardiovascular Disease

## 2021-06-07 ENCOUNTER — Ambulatory Visit: Payer: 59 | Admitting: Registered"

## 2021-07-20 ENCOUNTER — Other Ambulatory Visit: Payer: Self-pay | Admitting: Obstetrics and Gynecology

## 2021-07-20 DIAGNOSIS — Z1231 Encounter for screening mammogram for malignant neoplasm of breast: Secondary | ICD-10-CM

## 2021-08-31 LAB — HM DIABETES EYE EXAM

## 2021-09-01 ENCOUNTER — Encounter: Payer: Self-pay | Admitting: Internal Medicine

## 2021-09-01 ENCOUNTER — Other Ambulatory Visit: Payer: Self-pay

## 2021-09-01 ENCOUNTER — Ambulatory Visit (INDEPENDENT_AMBULATORY_CARE_PROVIDER_SITE_OTHER): Payer: 59 | Admitting: Internal Medicine

## 2021-09-01 VITALS — BP 116/72 | HR 75 | Temp 97.5°F | Resp 17 | Ht 64.0 in | Wt 152.6 lb

## 2021-09-01 DIAGNOSIS — Z114 Encounter for screening for human immunodeficiency virus [HIV]: Secondary | ICD-10-CM

## 2021-09-01 DIAGNOSIS — Z0001 Encounter for general adult medical examination with abnormal findings: Secondary | ICD-10-CM

## 2021-09-01 DIAGNOSIS — Z1159 Encounter for screening for other viral diseases: Secondary | ICD-10-CM

## 2021-09-01 DIAGNOSIS — Z23 Encounter for immunization: Secondary | ICD-10-CM | POA: Diagnosis not present

## 2021-09-01 DIAGNOSIS — E119 Type 2 diabetes mellitus without complications: Secondary | ICD-10-CM

## 2021-09-01 NOTE — Progress Notes (Signed)
 Subjective:    Patient ID: Brenda Chavez, female    DOB: 11/01/1960, 60 y.o.   MRN: [id number]  HPI  Pt presents to the clinic today for her annual exam.  Flu: 07/2020 Tetanus: unsure Covid: Pfizer x 2 Pneumovax: 11/2020 Shingrix: never Pap smear: 2022, [facility] Mammogram: 12/2020 Bone density: 2022, [facility] Colon screening: 04/2013 Vision screening: annually Dentist: biannually  Diet: She does eat meat. She consumes fruits and veggies. She is eating some fried foods. She drinks mostly water, coffee. Exercise: None  Review of Systems  Past Medical History:  Diagnosis Date   Abnormal Pap smear of cervix    Anxiety    CAD (coronary artery disease) cardiologist--- dr mcalhany   a. 04/2015 NSTEMI/PCI: LM nl, LAD 30d, D1 100 (2.25x12 Promus Premier DES), D2 small, RI small, LCX nl, OM1/2 nl, RCA/PDA nl, EF 45-50%.   Diabetes mellitus type 2, diet-controlled ([facility])    followed by pcp--- newly dx 03/ 2022   Family history of adverse reaction to anesthesia    mother-- ponv   GERD (gastroesophageal reflux disease)    Glaucoma, both eyes    History of non-ST elevation myocardial infarction (NSTEMI) 05/14/2015   s/p  PCI and DES   Hyperlipidemia    Osteoporosis    S/P drug eluting coronary stent placement 05/14/2015   DES x1 to D1   Wears glasses     Current Outpatient Medications  Medication Sig Dispense Refill   Ascorbic Acid (VITAMIN C) 1000 MG tablet Take 1,000 mg by mouth daily.     aspirin EC 81 MG tablet Take 1 tablet (81 mg total) by mouth daily. 90 tablet 3   atorvastatin (LIPITOR) 40 MG tablet TAKE 1 TABLET DAILY (KEEP UPCOMING APPOINTMENT FOR FURTHER REFILLS) 90 tablet 3   benzonatate (TESSALON) 200 MG capsule Take 1 capsule (200 mg total) by mouth 3 (three) times daily as needed for cough. 30 capsule 0   Blood Glucose Monitoring Suppl (FREESTYLE LITE) w/Device KIT 1 Device by Does not apply route daily. 1 kit 0   cholecalciferol (VITAMIN D)  1000 UNITS tablet Take 1,000 Units by mouth daily.     CINNAMON PO Take 1 capsule by mouth 2 (two) times daily.     citalopram (CELEXA) 40 MG tablet TAKE 1 TABLET BY MOUTH DAILY. MUST SCHEDULE ANNUAL EXAM FOR MORE REFILLS 30 tablet 1   esomeprazole (NEXIUM) 20 MG capsule Take 20 mg by mouth daily.     Ginkgo Biloba (GINKOBA PO) Take 500 mg by mouth 2 (two) times daily.     glucose blood (FREESTYLE TEST STRIPS) test strip Use as instructed 100 each 12   Lancets (FREESTYLE) lancets Use as instructed 100 each 12   latanoprost (XALATAN) 0.005 % ophthalmic solution Place 1 drop into both eyes at bedtime.     MAGNESIUM CITRATE PO Take 250 mg by mouth daily.     metoprolol tartrate (LOPRESSOR) 25 MG tablet Take 0.5 tablets (12.5 mg total) by mouth 2 (two) times daily. 90 tablet 3   nitroGLYCERIN (NITROSTAT) 0.4 MG SL tablet Place 1 tablet (0.4 mg total) under the tongue every 5 (five) minutes x 3 doses as needed for chest pain. 25 tablet 6   No current facility-administered medications for this visit.    Allergies  Allergen Reactions   Erythromycin Nausea And Vomiting   Floxin [Ofloxacin] Other (See Comments)    Unable to close eyes   Macrobid [Nitrofurantoin Macrocrystal] Nausea And Vomiting     Other     Other reaction(s): Unknown Other reaction(s): Unknown    Family History  Problem Relation Age of Onset   Breast cancer Cousin 19   Heart disease Mother    Heart disease Father    COPD Father    Heart attack Maternal Grandmother    Heart attack Paternal Grandmother    Breast cancer Maternal Aunt 62   Diabetes Maternal Aunt    Heart disease Paternal Aunt    Colon cancer Cousin    Breast cancer Cousin    Cancer Other    Hypertension Other    Ovarian cancer Neg Hx    Pancreatic cancer Neg Hx    Endometrial cancer Neg Hx     Social History   Socioeconomic History   Marital status: Married    Spouse name: Not on file   Number of children: Not on file   Years of education: Not  on file   Highest education level: Not on file  Occupational History   Not on file  Tobacco Use   Smoking status: Never   Smokeless tobacco: Never  Vaping Use   Vaping Use: Never used  Substance and Sexual Activity   Alcohol use: No   Drug use: Never   Sexual activity: Yes    Birth control/protection: Post-menopausal  Other Topics Concern   Not on file  Social History Narrative   Not on file   Social Determinants of Health   Financial Resource Strain: Not on file  Food Insecurity: Not on file  Transportation Needs: Not on file  Physical Activity: Not on file  Stress: Not on file  Social Connections: Not on file  Intimate Partner Violence: Not on file     Constitutional: Denies fever, malaise, fatigue, headache or abrupt weight changes.  HEENT: Denies eye pain, eye redness, ear pain, ringing in the ears, wax buildup, runny nose, nasal congestion, bloody nose, or sore throat. Respiratory: Denies difficulty breathing, shortness of breath, cough or sputum production.   Cardiovascular: Denies chest pain, chest tightness, palpitations or swelling in the hands or feet.  Gastrointestinal: Denies abdominal pain, bloating, constipation, diarrhea or blood in the stool.  GU: Denies urgency, frequency, pain with urination, burning sensation, blood in urine, odor or discharge. Musculoskeletal: Denies decrease in range of motion, difficulty with gait, muscle pain or joint pain and swelling.  Skin: Denies redness, rashes, lesions or ulcercations.  Neurological: Denies dizziness, difficulty with memory, difficulty with speech or problems with balance and coordination.  Psych: Denies anxiety, depression, SI/HI.  No other specific complaints in a complete review of systems (except as listed in HPI above).     Objective:   Physical Exam  BP 116/72 (BP Location: Right Arm, Patient Position: Sitting, Cuff Size: Normal)   Pulse 75   Temp (!) 97.5 F (36.4 C) (Temporal)   Resp 17   Ht 5'  4" (1.626 m)   Wt 152 lb 9.6 oz (69.2 kg)   SpO2 100%   BMI 26.19 kg/m   Wt Readings from Last 3 Encounters:  03/09/21 147 lb 3.2 oz (66.8 kg)  02/25/21 159 lb 12.8 oz (72.5 kg)  02/14/21 154 lb 3.2 oz (69.9 kg)    General: Appears her stated age, overweight,  in NAD. Skin: Warm, dry and intact. HEENT: Head: normal shape and size; Eyes: sclera white and EOMs intact;  Neck:  Neck supple, trachea midline. No masses, lumps or thyromegaly present.  Cardiovascular: Normal rate and rhythm. S1,S2 noted.  No  murmur, rubs or gallops noted. No JVD or BLE edema. No carotid bruits noted. Pulmonary/Chest: Normal effort and positive vesicular breath sounds. No respiratory distress. No wheezes, rales or ronchi noted.  Abdomen: Soft and nontender. Normal bowel sounds. No distention or masses noted. Liver, spleen and kidneys non palpable. Musculoskeletal: Strength 5/5 BUE/BLE. No difficulty with gait.  Neurological: Alert and oriented. Cranial nerves II-XII grossly intact. Coordination normal.  Psychiatric: Mood and affect normal. Behavior is normal. Judgment and thought content normal.    BMET    Component Value Date/Time   NA 140 02/07/2021 0958   K 4.5 02/07/2021 0958   CL 108 02/07/2021 0958   CO2 27 02/07/2021 0958   GLUCOSE 119 (H) 02/07/2021 0958   BUN 23 (H) 02/07/2021 0958   CREATININE 0.85 02/07/2021 0958   CALCIUM 9.7 02/07/2021 0958   GFRNONAA >60 02/07/2021 0958   GFRAA >60 05/15/2015 0259    Lipid Panel     Component Value Date/Time   CHOL 140 02/22/2021 0824   CHOL 127 01/28/2020 0947   TRIG 96 02/22/2021 0824   HDL 71 02/22/2021 0824   HDL 58 01/28/2020 0947   CHOLHDL 2.0 02/22/2021 0824   VLDL 16 07/07/2015 0813   LDLCALC 51 02/22/2021 0824    CBC    Component Value Date/Time   WBC 6.1 02/07/2021 0958   RBC 4.81 02/07/2021 0958   HGB 14.4 02/07/2021 0958   HCT 45.7 02/07/2021 0958   PLT 261 02/07/2021 0958   MCV 95.0 02/07/2021 0958   MCH 29.9 02/07/2021  0958   MCHC 31.5 02/07/2021 0958   RDW 13.3 02/07/2021 0958   LYMPHSABS 2.4 05/14/2015 0221   MONOABS 0.7 05/14/2015 0221   EOSABS 0.1 05/14/2015 0221   BASOSABS 0.0 05/14/2015 0221    Hgb A1C Lab Results  Component Value Date   HGBA1C 6.2 (H) 02/22/2021            Assessment & Plan:   Preventative Health Maintenance:  Flu shot UTD Tetanus today Pneumovax UTD Encouraged her to get her covid booster Discussed shingrix vaccine, she will check coverage with her insurance company Pap smear UTD, will request records Mammogram UTD Bone density UTD, will request records Colon screening UTD Encouraged her to consume a balanced diet and exercise regimen Advised her to see an eye doctor and dentist annually Will check CBC, CMET, Lipid, A1C, HIV and Hep C today  RTC in 6 months, follow up chronic conditions Webb Silversmith, NP This visit occurred during the SARS-CoV-2 public health emergency.  Safety protocols were in place, including screening questions prior to the visit, additional usage of staff PPE, and extensive cleaning of exam room while observing appropriate contact time as indicated for disinfecting solutions.

## 2021-09-01 NOTE — Patient Instructions (Signed)
Health Maintenance for Postmenopausal Women ?Menopause is a normal process in which your ability to get pregnant comes to an end. This process happens slowly over many months or years, usually between the ages of 48 and 55. Menopause is complete when you have missed your menstrual period for 12 months. ?It is important to talk with your health care provider about some of the most common conditions that affect women after menopause (postmenopausal women). These include heart disease, cancer, and bone loss (osteoporosis). Adopting a healthy lifestyle and getting preventive care can help to promote your health and wellness. The actions you take can also lower your chances of developing some of these common conditions. ?What are the signs and symptoms of menopause? ?During menopause, you may have the following symptoms: ?Hot flashes. These can be moderate or severe. ?Night sweats. ?Decrease in sex drive. ?Mood swings. ?Headaches. ?Tiredness (fatigue). ?Irritability. ?Memory problems. ?Problems falling asleep or staying asleep. ?Talk with your health care provider about treatment options for your symptoms. ?Do I need hormone replacement therapy? ?Hormone replacement therapy is effective in treating symptoms that are caused by menopause, such as hot flashes and night sweats. ?Hormone replacement carries certain risks, especially as you become older. If you are thinking about using estrogen or estrogen with progestin, discuss the benefits and risks with your health care provider. ?How can I reduce my risk for heart disease and stroke? ?The risk of heart disease, heart attack, and stroke increases as you age. One of the causes may be a change in the body's hormones during menopause. This can affect how your body uses dietary fats, triglycerides, and cholesterol. Heart attack and stroke are medical emergencies. There are many things that you can do to help prevent heart disease and stroke. ?Watch your blood pressure ?High  blood pressure causes heart disease and increases the risk of stroke. This is more likely to develop in people who have high blood pressure readings or are overweight. ?Have your blood pressure checked: ?Every 3-5 years if you are 18-39 years of age. ?Every year if you are 40 years old or older. ?Eat a healthy diet ? ?Eat a diet that includes plenty of vegetables, fruits, low-fat dairy products, and lean protein. ?Do not eat a lot of foods that are high in solid fats, added sugars, or sodium. ?Get regular exercise ?Get regular exercise. This is one of the most important things you can do for your health. Most adults should: ?Try to exercise for at least 150 minutes each week. The exercise should increase your heart rate and make you sweat (moderate-intensity exercise). ?Try to do strengthening exercises at least twice each week. Do these in addition to the moderate-intensity exercise. ?Spend less time sitting. Even light physical activity can be beneficial. ?Other tips ?Work with your health care provider to achieve or maintain a healthy weight. ?Do not use any products that contain nicotine or tobacco. These products include cigarettes, chewing tobacco, and vaping devices, such as e-cigarettes. If you need help quitting, ask your health care provider. ?Know your numbers. Ask your health care provider to check your cholesterol and your blood sugar (glucose). Continue to have your blood tested as directed by your health care provider. ?Do I need screening for cancer? ?Depending on your health history and family history, you may need to have cancer screenings at different stages of your life. This may include screening for: ?Breast cancer. ?Cervical cancer. ?Lung cancer. ?Colorectal cancer. ?What is my risk for osteoporosis? ?After menopause, you may be   at increased risk for osteoporosis. Osteoporosis is a condition in which bone destruction happens more quickly than new bone creation. To help prevent osteoporosis or  the bone fractures that can happen because of osteoporosis, you may take the following actions: ?If you are 19-50 years old, get at least 1,000 mg of calcium and at least 600 international units (IU) of vitamin D per day. ?If you are older than age 50 but younger than age 70, get at least 1,200 mg of calcium and at least 600 international units (IU) of vitamin D per day. ?If you are older than age 70, get at least 1,200 mg of calcium and at least 800 international units (IU) of vitamin D per day. ?Smoking and drinking excessive alcohol increase the risk of osteoporosis. Eat foods that are rich in calcium and vitamin D, and do weight-bearing exercises several times each week as directed by your health care provider. ?How does menopause affect my mental health? ?Depression may occur at any age, but it is more common as you become older. Common symptoms of depression include: ?Feeling depressed. ?Changes in sleep patterns. ?Changes in appetite or eating patterns. ?Feeling an overall lack of motivation or enjoyment of activities that you previously enjoyed. ?Frequent crying spells. ?Talk with your health care provider if you think that you are experiencing any of these symptoms. ?General instructions ?See your health care provider for regular wellness exams and vaccines. This may include: ?Scheduling regular health, dental, and eye exams. ?Getting and maintaining your vaccines. These include: ?Influenza vaccine. Get this vaccine each year before the flu season begins. ?Pneumonia vaccine. ?Shingles vaccine. ?Tetanus, diphtheria, and pertussis (Tdap) booster vaccine. ?Your health care provider may also recommend other immunizations. ?Tell your health care provider if you have ever been abused or do not feel safe at home. ?Summary ?Menopause is a normal process in which your ability to get pregnant comes to an end. ?This condition causes hot flashes, night sweats, decreased interest in sex, mood swings, headaches, or lack  of sleep. ?Treatment for this condition may include hormone replacement therapy. ?Take actions to keep yourself healthy, including exercising regularly, eating a healthy diet, watching your weight, and checking your blood pressure and blood sugar levels. ?Get screened for cancer and depression. Make sure that you are up to date with all your vaccines. ?This information is not intended to replace advice given to you by your health care provider. Make sure you discuss any questions you have with your health care provider. ?Document Revised: 01/31/2021 Document Reviewed: 01/31/2021 ?Elsevier Patient Education ? 2022 [employer] ? ?

## 2021-09-02 LAB — COMPLETE METABOLIC PANEL WITH GFR
AG Ratio: 1.7 (calc) (ref 1.0–2.5)
ALT: 39 U/L — ABNORMAL HIGH (ref 6–29)
AST: 26 U/L (ref 10–35)
Albumin: 4.6 g/dL (ref 3.6–5.1)
Alkaline phosphatase (APISO): 89 U/L (ref 37–153)
BUN: 18 mg/dL (ref 7–25)
CO2: 27 mmol/L (ref 20–32)
Calcium: 9.9 mg/dL (ref 8.6–10.4)
Chloride: 106 mmol/L (ref 98–110)
Creat: 0.9 mg/dL (ref 0.50–1.03)
Globulin: 2.7 g/dL (calc) (ref 1.9–3.7)
Glucose, Bld: 117 mg/dL — ABNORMAL HIGH (ref 65–99)
Potassium: 4.5 mmol/L (ref 3.5–5.3)
Sodium: 142 mmol/L (ref 135–146)
Total Bilirubin: 0.4 mg/dL (ref 0.2–1.2)
Total Protein: 7.3 g/dL (ref 6.1–8.1)
eGFR: 74 mL/min/{1.73_m2} (ref >=60)

## 2021-09-02 LAB — LIPID PANEL
Cholesterol: 146 mg/dL (ref <200)
HDL: 71 mg/dL (ref >=50)
LDL Cholesterol (Calc): 55 mg/dL (calc)
Non-HDL Cholesterol (Calc): 75 mg/dL (calc) (ref <130)
Total CHOL/HDL Ratio: 2.1 (calc) (ref <5.0)
Triglycerides: 117 mg/dL (ref <150)

## 2021-09-02 LAB — CBC
HCT: 44.6 % (ref 35.0–45.0)
Hemoglobin: 14.7 g/dL (ref 11.7–15.5)
MCH: 30.2 pg (ref 27.0–33.0)
MCHC: 33 g/dL (ref 32.0–36.0)
MCV: 91.8 fL (ref 80.0–100.0)
MPV: 11.3 fL (ref 7.5–12.5)
Platelets: 291 10*3/uL (ref 140–400)
RBC: 4.86 10*6/uL (ref 3.80–5.10)
RDW: 12.8 % (ref 11.0–15.0)
WBC: 6.4 10*3/uL (ref 3.8–10.8)

## 2021-09-02 LAB — HEPATITIS C ANTIBODY
Hepatitis C Ab: NONREACTIVE
SIGNAL TO CUT-OFF: 0.08 (ref <1.00)

## 2021-09-02 LAB — HIV ANTIBODY (ROUTINE TESTING W REFLEX): HIV 1&2 Ab, 4th Generation: NONREACTIVE

## 2021-09-02 LAB — HEMOGLOBIN A1C
Hgb A1c MFr Bld: 6.3 % of total Hgb — ABNORMAL HIGH (ref <5.7)
Mean Plasma Glucose: 134 mg/dL
eAG (mmol/L): 7.4 mmol/L

## 2021-11-14 ENCOUNTER — Telehealth: Payer: Self-pay | Admitting: Internal Medicine

## 2021-11-14 NOTE — Telephone Encounter (Signed)
ERROR

## 2021-11-21 LAB — HM PAP SMEAR

## 2021-12-13 ENCOUNTER — Encounter: Payer: Self-pay | Admitting: Internal Medicine

## 2021-12-22 ENCOUNTER — Encounter: Payer: Self-pay | Admitting: Internal Medicine

## 2022-01-09 ENCOUNTER — Ambulatory Visit: Payer: 59

## 2022-03-03 ENCOUNTER — Ambulatory Visit: Payer: 59 | Admitting: Internal Medicine

## 2022-03-06 ENCOUNTER — Ambulatory Visit: Payer: 59 | Admitting: Internal Medicine

## 2022-03-21 ENCOUNTER — Ambulatory Visit: Payer: 59 | Admitting: Family Medicine

## 2022-03-22 ENCOUNTER — Ambulatory Visit
Admission: RE | Admit: 2022-03-22 | Discharge: 2022-03-22 | Disposition: A | Discharge status: Home / Self Care | Payer: 59 | Source: Ambulatory Visit | Attending: Obstetrics and Gynecology | Admitting: Obstetrics and Gynecology

## 2022-03-22 DIAGNOSIS — Z1231 Encounter for screening mammogram for malignant neoplasm of breast: Secondary | ICD-10-CM

## 2022-04-07 ENCOUNTER — Other Ambulatory Visit: Payer: Self-pay | Admitting: Cardiovascular Disease

## 2022-05-22 ENCOUNTER — Encounter: Payer: Self-pay | Admitting: Family

## 2022-05-22 ENCOUNTER — Ambulatory Visit (INDEPENDENT_AMBULATORY_CARE_PROVIDER_SITE_OTHER): Payer: 59 | Admitting: Family

## 2022-05-22 VITALS — BP 122/70 | HR 74 | Temp 97.9°F | Resp 16 | Ht 64.0 in | Wt 150.0 lb

## 2022-05-22 DIAGNOSIS — I214 Non-ST elevation (NSTEMI) myocardial infarction: Secondary | ICD-10-CM

## 2022-05-22 DIAGNOSIS — R7401 Elevation of levels of liver transaminase levels: Secondary | ICD-10-CM

## 2022-05-22 DIAGNOSIS — T466X5A Adverse effect of antihyperlipidemic and antiarteriosclerotic drugs, initial encounter: Secondary | ICD-10-CM

## 2022-05-22 DIAGNOSIS — E119 Type 2 diabetes mellitus without complications: Secondary | ICD-10-CM

## 2022-05-22 DIAGNOSIS — E785 Hyperlipidemia, unspecified: Secondary | ICD-10-CM | POA: Diagnosis not present

## 2022-05-22 DIAGNOSIS — E559 Vitamin D deficiency, unspecified: Secondary | ICD-10-CM | POA: Diagnosis not present

## 2022-05-22 DIAGNOSIS — K219 Gastro-esophageal reflux disease without esophagitis: Secondary | ICD-10-CM | POA: Diagnosis not present

## 2022-05-22 DIAGNOSIS — M8580 Other specified disorders of bone density and structure, unspecified site: Secondary | ICD-10-CM | POA: Insufficient documentation

## 2022-05-22 DIAGNOSIS — J301 Allergic rhinitis due to pollen: Secondary | ICD-10-CM

## 2022-05-22 LAB — MICROALBUMIN / CREATININE URINE RATIO
Creatinine,U: 356.8 mg/dL
Microalb Creat Ratio: 0.6 mg/g (ref 0.0–30.0)
Microalb, Ur: 2.1 mg/dL — ABNORMAL HIGH (ref 0.0–1.9)

## 2022-05-22 LAB — LIPID PANEL
Cholesterol: 128 mg/dL (ref 0–200)
HDL: 67.2 mg/dL (ref >39.00)
LDL Cholesterol: 35 mg/dL (ref 0–99)
NonHDL: 60.84
Total CHOL/HDL Ratio: 2
Triglycerides: 131 mg/dL (ref 0.0–149.0)
VLDL: 26.2 mg/dL (ref 0.0–40.0)

## 2022-05-22 LAB — BASIC METABOLIC PANEL
BUN: 19 mg/dL (ref 6–23)
CO2: 28 mEq/L (ref 19–32)
Calcium: 9.9 mg/dL (ref 8.4–10.5)
Chloride: 102 mEq/L (ref 96–112)
Creatinine, Ser: 0.89 mg/dL (ref 0.40–1.20)
GFR: 70.69 mL/min (ref >60.00)
Glucose, Bld: 114 mg/dL — ABNORMAL HIGH (ref 70–99)
Potassium: 4.5 mEq/L (ref 3.5–5.1)
Sodium: 141 mEq/L (ref 135–145)

## 2022-05-22 LAB — HEMOGLOBIN A1C: Hgb A1c MFr Bld: 6.7 % — ABNORMAL HIGH (ref 4.6–6.5)

## 2022-05-22 LAB — VITAMIN D 25 HYDROXY (VIT D DEFICIENCY, FRACTURES): VITD: 60.3 ng/mL (ref 30.00–100.00)

## 2022-05-22 MED ORDER — AZELASTINE HCL 0.1 % NA SOLN: 2.0000 | Freq: Two times a day (BID) | NASAL | 12 refills | Status: DC

## 2022-05-22 MED ORDER — AZELASTINE HCL 0.1 % NA SOLN: 1.0000 | Freq: Two times a day (BID) | NASAL | 12 refills | Status: DC

## 2022-05-22 NOTE — Progress Notes (Incomplete)
Established Patient Office Visit  Subjective:  Patient ID: Brenda Chavez, female    DOB: 1961-05-08  Age: 61 y.o. MRN: [id number]  CC:  Chief Complaint  Patient presents with  . Transitions Of Care    HPI Brenda Chavez is here for a transition of care visit.  Prior provider was: Webb Silversmith, FNP   Pt is without acute concerns.   03/22/22 mammogram.  Pap 11/21/21 , atypical grandular cells,, f/u tomorrow 8/29 with gyn for repeat pap  Colposcopy 01/10/21 , 10/21/19  DEXA: 11/18/20  Colonoscopy: due next year 2024.   chronic concerns:  MI 2016, cardiac cath with one stent placed.  Sees cardiologist annually. Has appt next month 9/20.  On metoprolol 25 mg also once daily. Has nitroglycerin prn   Osteopenia: was on fosamax x 5 years. Currently on drug holiday.   GYN Brenda Chavez   DM2: 6.5 a1c history has since remained stable with diet.  Lab Results  Component Value Date   HGBA1C 6.3 (H) 09/01/2021   Elevated alt, very mild at 29. Stable from six years ago. No ruq abdominal pain. Does drink tea, but not daily. Rare. No nsaids and or tylenol. Only daily baby aspirin.   GERD: has been on nexium chronically. Does have osteopenia. Does eat a good amount of spicy foods, and also chocolate.   Past Medical History:  Diagnosis Date  . Abnormal Pap smear of cervix   . Anxiety   . CAD (coronary artery disease) cardiologist--- dr Angelena Form   a. 04/2015 NSTEMI/PCI: LM nl, LAD 30d, D1 100 (2.25x12 Promus Premier DES), D2 small, RI small, LCX nl, OM1/2 nl, RCA/PDA nl, EF 45-50%.  . Diabetes mellitus type 2, diet-controlled ([place])    followed by pcp--- newly dx 03/ 2022  . Family history of adverse reaction to anesthesia    mother-- ponv  . GERD (gastroesophageal reflux disease)   . Glaucoma, both eyes   . History of non-ST elevation myocardial infarction (NSTEMI) 05/14/2015   s/p  PCI and DES  . Hyperlipidemia   . Osteoporosis   . S/P drug eluting coronary stent placement 05/14/2015    DES x1 to D1  . Wears glasses     Past Surgical History:  Procedure Laterality Date  . CARDIAC CATHETERIZATION N/A 05/14/2015   Procedure: Left Heart Cath and Coronary Angiography;  Surgeon: Burnell Blanks, MD;  Location: [facility];  Service: Cardiovascular;  Laterality: N/A;  . CARDIAC CATHETERIZATION N/A 05/14/2015   Procedure: Coronary Stent Intervention;  Surgeon: Burnell Blanks, MD;  Location: [facility];  Service: Cardiovascular;  Laterality: N/A;  . CERVICAL CONIZATION W/BX N/A 02/09/2021   Procedure: COLD KNIFE CONIZATION CERVIX;  Surgeon: Lafonda Mosses, MD;  Location: [facility];  Service: Gynecology;  Laterality: N/A;  . DILATION AND CURETTAGE OF UTERUS  2021  . HYSTEROSCOPY WITH D & C N/A 02/09/2021   Procedure: DILATATION AND CURETTAGE /HYSTEROSCOPY WITH MYOSURE, POLYPECTOMY;  Surgeon: Lafonda Mosses, MD;  Location: [facility];  Service: Gynecology;  Laterality: N/A;  . WISDOM TOOTH EXTRACTION  1984    Family History  Problem Relation Age of Onset  . Breast cancer Cousin 14  . Heart disease Mother   . Heart disease Father   . COPD Father   . Heart attack Maternal Grandmother   . Heart attack Paternal Grandmother   . Breast cancer Maternal Aunt 30  . Diabetes Maternal Aunt   .  Heart disease Paternal Aunt   . Colon cancer Cousin   . Breast cancer Cousin   . Cancer Other   . Hypertension Other   . Ovarian cancer Neg Hx   . Pancreatic cancer Neg Hx   . Endometrial cancer Neg Hx     Social History   Socioeconomic History  . Marital status: Married    Spouse name: Not on file  . Number of children: Not on file  . Years of education: Not on file  . Highest education level: Not on file  Occupational History  . Not on file  Tobacco Use  . Smoking status: Never  . Smokeless tobacco: Never  Vaping Use  . Vaping Use: Never used  Substance and Sexual Activity  . Alcohol use: No  . Drug  use: Never  . Sexual activity: Yes    Birth control/protection: Post-menopausal  Other Topics Concern  . Not on file  Social History Narrative  . Not on file   Social Determinants of Health   Financial Resource Strain: Not on file  Food Insecurity: Not on file  Transportation Needs: Not on file  Physical Activity: Not on file  Stress: Not on file  Social Connections: Not on file  Intimate Partner Violence: Not on file    Outpatient Medications Prior to Visit  Medication Sig Dispense Refill  . aspirin EC 81 MG tablet Take 1 tablet (81 mg total) by mouth daily. 90 tablet 3  . atorvastatin (LIPITOR) 40 MG tablet TAKE 1 TABLET DAILY (KEEP UPCOMING APPOINTMENT FOR FURTHER REFILLS) 90 tablet 3  . Blood Glucose Monitoring Suppl (FREESTYLE LITE) w/Device KIT 1 Device by Does not apply route daily. 1 kit 0  . cholecalciferol (VITAMIN D) 1000 UNITS tablet Take 1,000 Units by mouth daily.    Marland Kitchen esomeprazole (NEXIUM) 20 MG capsule Take 20 mg by mouth daily.    Marland Kitchen glucose blood (FREESTYLE TEST STRIPS) test strip Use as instructed 100 each 12  . Lancets (FREESTYLE) lancets Use as instructed 100 each 12  . latanoprost (XALATAN) 0.005 % ophthalmic solution Place 1 drop into both eyes at bedtime.    Marland Kitchen MAGNESIUM CITRATE PO Take 250 mg by mouth daily.    . metoprolol tartrate (LOPRESSOR) 25 MG tablet TAKE ONE-HALF (1/2) TABLET TWICE A DAY 90 tablet 0  . nitroGLYCERIN (NITROSTAT) 0.4 MG SL tablet Place 1 tablet (0.4 mg total) under the tongue every 5 (five) minutes x 3 doses as needed for chest pain. 25 tablet 6   No facility-administered medications prior to visit.    Allergies  Allergen Reactions  . Erythromycin Nausea And Vomiting  . Floxin [Ofloxacin] Other (See Comments)    Unable to close eyes  . Macrobid [Nitrofurantoin Macrocrystal] Nausea And Vomiting  . Other     Other reaction(s): Unknown Other reaction(s): Unknown    ROS Review of Systems  Review of Systems  Respiratory:   Negative for shortness of breath.   Cardiovascular:  Negative for chest pain and palpitations.  Gastrointestinal:  Negative for constipation and diarrhea.  Genitourinary:  Negative for dysuria, frequency and urgency.  Musculoskeletal:  Negative for myalgias.  Psychiatric/Behavioral:  Negative for depression and suicidal ideas.   All other systems reviewed and are negative.    Objective:    Physical Exam  Gen: NAD, resting comfortably CV: RRR with no murmurs appreciated Pulm: NWOB, CTAB with no crackles, wheezes, or rhonchi Skin: warm, dry Psych: Normal affect and thought content  BP 122/70  Pulse 74   Temp 97.9 F (36.6 C)   Resp 16   Ht 5' 4" (1.626 m)   Wt 150 lb (68 kg)   SpO2 97%   BMI 25.75 kg/m  Wt Readings from Last 3 Encounters:  05/22/22 150 lb (68 kg)  09/01/21 152 lb 9.6 oz (69.2 kg)  03/09/21 147 lb 3.2 oz (66.8 kg)     Health Maintenance Due  Topic Date Due  . Zoster Vaccines- Shingrix (1 of 2) Never done  . COVID-19 Vaccine (4 - Mixed Product series) 03/23/2020  . URINE MICROALBUMIN  02/22/2022  . FOOT EXAM  02/25/2022  . HEMOGLOBIN A1C  03/02/2022  . INFLUENZA VACCINE  04/25/2022    There are no preventive care reminders to display for this patient.  Lab Results  Component Value Date   TSH 2.251 05/14/2015   Lab Results  Component Value Date   WBC 6.4 09/01/2021   HGB 14.7 09/01/2021   HCT 44.6 09/01/2021   MCV 91.8 09/01/2021   PLT 291 09/01/2021   Lab Results  Component Value Date   NA 142 09/01/2021   K 4.5 09/01/2021   CO2 27 09/01/2021   GLUCOSE 117 (H) 09/01/2021   BUN 18 09/01/2021   CREATININE 0.90 09/01/2021   BILITOT 0.4 09/01/2021   ALKPHOS 98 01/28/2020   AST 26 09/01/2021   ALT 39 (H) 09/01/2021   PROT 7.3 09/01/2021   ALBUMIN 4.4 01/28/2020   CALCIUM 9.9 09/01/2021   ANIONGAP 5 02/07/2021   EGFR 74 09/01/2021   Lab Results  Component Value Date   CHOL 146 09/01/2021   Lab Results  Component Value Date    HDL 71 09/01/2021   Lab Results  Component Value Date   LDLCALC 55 09/01/2021   Lab Results  Component Value Date   TRIG 117 09/01/2021   Lab Results  Component Value Date   CHOLHDL 2.1 09/01/2021   Lab Results  Component Value Date   HGBA1C 6.3 (H) 09/01/2021      Assessment & Plan:   Problem List Items Addressed This Visit   None   No orders of the defined types were placed in this encounter.   Follow-up: No follow-ups on file.    [name], FNP

## 2022-05-22 NOTE — Patient Instructions (Addendum)
Welcome to our clinic, I am happy to have you as my new patient. I am excited to continue on this healthcare journey with you.  Stop by the lab prior to leaving today. I will notify you of your results once received.   Please keep in mind Any my chart messages you send have up to a three business day turnaround for a response.  Phone calls may take up to a one full business day turnaround for a  response.   If you need a medication refill I recommend you request it through the pharmacy as this is easiest for [place] rather than sending a message and or phone call.   Due to recent changes in healthcare laws, you may see results of your imaging and/or laboratory studies on MyChart before I have had a chance to review them.  I understand that in some cases there may be results that are confusing or concerning to you. Please understand that not all results are received at the same time and often I may need to interpret multiple results in order to provide you with the best plan of care or course of treatment. Therefore, I ask that you please give me 2 business days to thoroughly review all your results before contacting my office for clarification. Should we see a critical lab result, you will be contacted sooner.  Trial astelin nose spray can also consider daily flonase along with this.   Trial stop nexium with new heartburn diet, and see if you can go without nexium as this can increase your risk for worsening bone breakdown with osteopenia.   It was a pleasure seeing you today! Please do not hesitate to reach out with any questions and or concerns.  Regards,   [name] FNP-C

## 2022-05-23 DIAGNOSIS — J301 Allergic rhinitis due to pollen: Secondary | ICD-10-CM | POA: Insufficient documentation

## 2022-05-23 DIAGNOSIS — R7401 Elevation of levels of liver transaminase levels: Secondary | ICD-10-CM | POA: Insufficient documentation

## 2022-05-23 DIAGNOSIS — T466X5A Adverse effect of antihyperlipidemic and antiarteriosclerotic drugs, initial encounter: Secondary | ICD-10-CM | POA: Insufficient documentation

## 2022-05-23 NOTE — Progress Notes (Signed)
Established Patient Office Visit  Subjective:  Patient ID: Brenda Chavez, female    DOB: 07/27/1961  Age: 61 y.o. MRN: [id number]  CC:  Chief Complaint  Patient presents with   Transitions Of Care    HPI Brenda Chavez is here for a transition of care visit.  Prior provider was: Webb Silversmith, FNP    Pt is without acute concerns.    03/22/22 mammogram.  Pap 11/21/21 , atypical grandular cells,, f/u tomorrow 8/29 with gyn for repeat pap  Colposcopy 01/10/21 , 10/21/19  DEXA: 11/18/20  Colonoscopy: due next year 2024.    chronic concerns:   MI 2016, cardiac cath with one stent placed.  Sees cardiologist annually. Has appt next month 9/20.  On metoprolol 25 mg also once daily. Has nitroglycerin prn    Osteopenia: was on fosamax x 5 years. Currently on drug holiday.    GYN [name]    DM2: 6.5 a1c history has since remained stable with diet.  Recent Labs       Lab Results  Component Value Date    HGBA1C 6.3 (H) 09/01/2021      Elevated alt, very mild at 29. Stable from six years ago. No ruq abdominal pain. Does drink tea, but not daily. Rare. No nsaids and or tylenol. Only daily baby aspirin.    GERD: has been on nexium chronically. Does have osteopenia. Does eat a good amount of spicy foods, and also chocolate     Past Medical History:  Diagnosis Date   Abnormal Pap smear of cervix    Anxiety    CAD (coronary artery disease) cardiologist--- dr Angelena Form   a. 04/2015 NSTEMI/PCI: LM nl, LAD 30d, D1 100 (2.25x12 Promus Premier DES), D2 small, RI small, LCX nl, OM1/2 nl, RCA/PDA nl, EF 45-50%.   Diabetes mellitus type 2, diet-controlled ([place])    followed by pcp--- newly dx 03/ 2022   Family history of adverse reaction to anesthesia    mother-- ponv   GERD (gastroesophageal reflux disease)    Glaucoma, both eyes    History of non-ST elevation myocardial infarction (NSTEMI) 05/14/2015   s/p  PCI and DES   Hyperlipidemia    Osteoporosis    S/P drug eluting coronary  stent placement 05/14/2015   DES x1 to D1   Wears glasses     Past Surgical History:  Procedure Laterality Date   CARDIAC CATHETERIZATION N/A 05/14/2015   Procedure: Left Heart Cath and Coronary Angiography;  Surgeon: Burnell Blanks, MD;  Location: [facility];  Service: Cardiovascular;  Laterality: N/A;   CARDIAC CATHETERIZATION N/A 05/14/2015   Procedure: Coronary Stent Intervention;  Surgeon: Burnell Blanks, MD;  Location: [facility];  Service: Cardiovascular;  Laterality: N/A;   CERVICAL CONIZATION W/BX N/A 02/09/2021   Procedure: COLD KNIFE CONIZATION CERVIX;  Surgeon: Lafonda Mosses, MD;  Location: [facility];  Service: Gynecology;  Laterality: N/A;   DILATION AND CURETTAGE OF UTERUS  2021   HYSTEROSCOPY WITH D & C N/A 02/09/2021   Procedure: DILATATION AND CURETTAGE /HYSTEROSCOPY WITH MYOSURE, POLYPECTOMY;  Surgeon: Lafonda Mosses, MD;  Location: [facility];  Service: Gynecology;  Laterality: N/A;   WISDOM TOOTH EXTRACTION  1984    Family History  Problem Relation Age of Onset   Breast cancer Cousin 75   Heart disease Mother    Heart disease Father    COPD Father    Heart attack Maternal Grandmother  Heart attack Paternal Grandmother    Breast cancer Maternal Aunt 21   Diabetes Maternal Aunt    Heart disease Paternal Aunt    Colon cancer Cousin    Breast cancer Cousin    Cancer Other    Hypertension Other    Ovarian cancer Neg Hx    Pancreatic cancer Neg Hx    Endometrial cancer Neg Hx     Social History   Socioeconomic History   Marital status: Married    Spouse name: Not on file   Number of children: 0   Years of education: Not on file   Highest education level: Not on file  Occupational History   Occupation: retired  Tobacco Use   Smoking status: Never   Smokeless tobacco: Never  Vaping Use   Vaping Use: Never used  Substance and Sexual Activity   Alcohol use: No   Drug use: Never    Sexual activity: Yes    Partners: Male    Birth control/protection: Post-menopausal  Other Topics Concern   Not on file  Social History Narrative   Not on file   Social Determinants of Health   Financial Resource Strain: Not on file  Food Insecurity: Not on file  Transportation Needs: Not on file  Physical Activity: Not on file  Stress: Not on file  Social Connections: Not on file  Intimate Partner Violence: Not on file    Outpatient Medications Prior to Visit  Medication Sig Dispense Refill   aspirin EC 81 MG tablet Take 1 tablet (81 mg total) by mouth daily. 90 tablet 3   atorvastatin (LIPITOR) 40 MG tablet TAKE 1 TABLET DAILY (KEEP UPCOMING APPOINTMENT FOR FURTHER REFILLS) 90 tablet 3   Blood Glucose Monitoring Suppl (FREESTYLE LITE) w/Device KIT 1 Device by Does not apply route daily. 1 kit 0   cholecalciferol (VITAMIN D) 1000 UNITS tablet Take 1,000 Units by mouth daily.     esomeprazole (NEXIUM) 20 MG capsule Take 20 mg by mouth daily.     glucose blood (FREESTYLE TEST STRIPS) test strip Use as instructed 100 each 12   Lancets (FREESTYLE) lancets Use as instructed 100 each 12   latanoprost (XALATAN) 0.005 % ophthalmic solution Place 1 drop into both eyes at bedtime.     MAGNESIUM CITRATE PO Take 250 mg by mouth daily.     metoprolol tartrate (LOPRESSOR) 25 MG tablet TAKE ONE-HALF (1/2) TABLET TWICE A DAY 90 tablet 0   nitroGLYCERIN (NITROSTAT) 0.4 MG SL tablet Place 1 tablet (0.4 mg total) under the tongue every 5 (five) minutes x 3 doses as needed for chest pain. 25 tablet 6   No facility-administered medications prior to visit.    Allergies  Allergen Reactions   Erythromycin Nausea And Vomiting    Ended up in hospital needing IVF     Macrobid [Nitrofurantoin Macrocrystal] Nausea And Vomiting    Ended up in hospital needing IVF     Floxin [Ofloxacin] Other (See Comments)    Unable to close eyes    ROS Review of Systems  Review of Systems  Respiratory:   Negative for shortness of breath.   Cardiovascular:  Negative for chest pain and palpitations.  Gastrointestinal:  Negative for constipation and diarrhea.  Genitourinary:  Negative for dysuria, frequency and urgency.  Musculoskeletal:  Negative for myalgias.  Psychiatric/Behavioral:  Negative for depression and suicidal ideas.   All other systems reviewed and are negative.    Objective:    Physical Exam  Gen: NAD,  resting comfortably CV: RRR with no murmurs appreciated Pulm: NWOB, CTAB with no crackles, wheezes, or rhonchi Skin: warm, dry Psych: Normal affect and thought content  BP 122/70   Pulse 74   Temp 97.9 F (36.6 C)   Resp 16   Ht _0  (1.626 m)   Wt 150 lb (68 kg)   SpO2 97%   BMI 25.75 kg/m  Wt Readings from Last 3 Encounters:  05/22/22 150 lb (68 kg)  09/01/21 152 lb 9.6 oz (69.2 kg)  03/09/21 147 lb 3.2 oz (66.8 kg)     Health Maintenance Due  Topic Date Due   Zoster Vaccines- Shingrix (1 of 2) Never done   COVID-19 Vaccine (4 - Mixed Product series) 03/23/2020   FOOT EXAM  02/25/2022   INFLUENZA VACCINE  04/25/2022    There are no preventive care reminders to display for this patient.  Lab Results  Component Value Date   TSH 2.251 05/14/2015   Lab Results  Component Value Date   WBC 6.4 09/01/2021   HGB 14.7 09/01/2021   HCT 44.6 09/01/2021   MCV 91.8 09/01/2021   PLT 291 09/01/2021   Lab Results  Component Value Date   NA 141 05/22/2022   K 4.5 05/22/2022   CO2 28 05/22/2022   GLUCOSE 114 (H) 05/22/2022   BUN 19 05/22/2022   CREATININE 0.89 05/22/2022   BILITOT 0.4 09/01/2021   ALKPHOS 98 01/28/2020   AST 26 09/01/2021   ALT 39 (H) 09/01/2021   PROT 7.3 09/01/2021   ALBUMIN 4.4 01/28/2020   CALCIUM 9.9 05/22/2022   ANIONGAP 5 02/07/2021   EGFR 74 09/01/2021   GFR 70.69 05/22/2022   Lab Results  Component Value Date   CHOL 128 05/22/2022   Lab Results  Component Value Date   HDL 67.20 05/22/2022   Lab Results   Component Value Date   LDLCALC 35 05/22/2022   Lab Results  Component Value Date   TRIG 131.0 05/22/2022   Lab Results  Component Value Date   CHOLHDL 2 05/22/2022   Lab Results  Component Value Date   HGBA1C 6.7 (H) 05/22/2022      Assessment & Plan:   Problem List Items Addressed This Visit       Cardiovascular and Mediastinum   NSTEMI (non-ST elevated myocardial infarction) ([place])    Continue baby aspirin and lipitor as well as metoprolol as prescribed F/u with cardiologist as scheduled        Respiratory   Seasonal allergic rhinitis due to pollen    rx sent for astelin 0.1% nasal spray  Recommend flonase daily as well       Relevant Medications   azelastine (ASTELIN) 0.1 % nasal spray     Digestive   Gastroesophageal reflux disease without esophagitis    Try to decrease and or avoid spicy foods, fried fatty foods, and also caffeine and chocolate as these can increase heartburn symptoms.          Endocrine   Type 2 diabetes mellitus without complication, without long-term current use of insulin (HCC)    a1c stable and controlled without medications Continue with diabetic diet exercise as tolerated  a1c ordered today pending results      Relevant Orders   Hemoglobin A1c (Completed)   Basic metabolic panel (Completed)   Microalbumin / creatinine urine ratio (Completed)     Musculoskeletal and Integument   Osteopenia - Primary    dexa due 2/24  Other   Hyperlipidemia LDL goal <70    Ordered lipid panel, pending results. Work on low cholesterol diet and exercise as tolerated Continue atorvastatin      Relevant Orders   Lipid panel (Completed)   Microalbumin / creatinine urine ratio (Completed)   Vitamin D deficiency    Ordered vitamin d pending results.        Relevant Orders   VITAMIN D 25 Hydroxy (Vit-D Deficiency, Fractures) (Completed)   Elevated ALT measurement    Repeat bmp today pending results      Relevant Orders    Basic metabolic panel (Completed)    Meds ordered this encounter  Medications   DISCONTD: azelastine (ASTELIN) 0.1 % nasal spray    Sig: Place 1 spray into both nostrils 2 (two) times daily. Use in each nostril as directed    Dispense:  30 mL    Refill:  12    Order Specific Question:   Supervising Provider    Answer:   Diona Browner, AMY E [[id number]]   azelastine (ASTELIN) 0.1 % nasal spray    Sig: Place 2 sprays into both nostrils 2 (two) times daily. Use in each nostril as directed    Dispense:  30 mL    Refill:  12    Order Specific Question:   Supervising Provider    Answer:   Diona Browner, AMY E [[id number]]    Follow-up: Return for annual physical exam .    [name], FNP

## 2022-05-23 NOTE — Assessment & Plan Note (Signed)
rx sent for astelin 0.1% nasal spray  Recommend flonase daily as well

## 2022-05-23 NOTE — Assessment & Plan Note (Signed)
a1c stable and controlled without medications Continue with diabetic diet exercise as tolerated  a1c ordered today pending results

## 2022-05-23 NOTE — Assessment & Plan Note (Signed)
Continue baby aspirin and lipitor as well as metoprolol as prescribed F/u with cardiologist as scheduled

## 2022-05-23 NOTE — Assessment & Plan Note (Signed)
Ordered vitamin d pending results.   

## 2022-05-23 NOTE — Assessment & Plan Note (Signed)
Try to decrease and or avoid spicy foods, fried fatty foods, and also caffeine and chocolate as these can increase heartburn symptoms.   

## 2022-05-23 NOTE — Assessment & Plan Note (Signed)
dexa due 2/24

## 2022-05-23 NOTE — Assessment & Plan Note (Signed)
Ordered lipid panel, pending results. Work on low cholesterol diet and exercise as tolerated Continue atorvastatin  

## 2022-05-23 NOTE — Assessment & Plan Note (Signed)
Repeat bmp today pending results 

## 2022-05-24 ENCOUNTER — Other Ambulatory Visit: Payer: Self-pay | Admitting: Family

## 2022-05-24 DIAGNOSIS — R809 Proteinuria, unspecified: Secondary | ICD-10-CM

## 2022-05-24 MED ORDER — LOSARTAN POTASSIUM 25 MG PO TABS: 25.0000 mg | ORAL_TABLET | Freq: Every day | ORAL | 1 refills | Status: DC

## 2022-05-25 ENCOUNTER — Telehealth: Payer: Self-pay | Admitting: Family

## 2022-05-25 NOTE — Telephone Encounter (Signed)
Patient called in and had some questions for Tabitha. She had questions regarding her diabetes diagnoses, and she stated that [place] had some questions for her also. Thank you!

## 2022-05-26 NOTE — Telephone Encounter (Signed)
Yes Brenda Chavez please set up for video or in office, whatever is patient preference to discuss these questions further.

## 2022-05-26 NOTE — Telephone Encounter (Signed)
Pt appointment made for 9/5 @ 8:40

## 2022-05-30 ENCOUNTER — Ambulatory Visit: Payer: 59 | Attending: Cardiovascular Disease | Admitting: Cardiovascular Disease

## 2022-05-30 ENCOUNTER — Encounter: Payer: Self-pay | Admitting: Family

## 2022-05-30 ENCOUNTER — Telehealth (INDEPENDENT_AMBULATORY_CARE_PROVIDER_SITE_OTHER): Payer: 59 | Admitting: Family

## 2022-05-30 ENCOUNTER — Encounter: Payer: Self-pay | Admitting: Cardiovascular Disease

## 2022-05-30 VITALS — BP 122/72 | HR 59 | Ht 64.0 in | Wt 149.8 lb

## 2022-05-30 DIAGNOSIS — E785 Hyperlipidemia, unspecified: Secondary | ICD-10-CM

## 2022-05-30 DIAGNOSIS — I2510 Atherosclerotic heart disease of native coronary artery without angina pectoris: Secondary | ICD-10-CM | POA: Diagnosis not present

## 2022-05-30 DIAGNOSIS — E7800 Pure hypercholesterolemia, unspecified: Secondary | ICD-10-CM

## 2022-05-30 DIAGNOSIS — E119 Type 2 diabetes mellitus without complications: Secondary | ICD-10-CM | POA: Diagnosis not present

## 2022-05-30 MED ORDER — METOPROLOL TARTRATE 25 MG PO TABS: ORAL_TABLET | ORAL | 3 refills | Status: DC

## 2022-05-30 MED ORDER — NITROGLYCERIN 0.4 MG SL SUBL: 0.4000 mg | SUBLINGUAL_TABLET | SUBLINGUAL | 6 refills | Status: AC | PRN

## 2022-05-30 MED ORDER — ATORVASTATIN CALCIUM 40 MG PO TABS: ORAL_TABLET | ORAL | 3 refills | Status: DC

## 2022-05-30 MED ORDER — METFORMIN HCL 500 MG PO TABS: 500.0000 mg | ORAL_TABLET | Freq: Two times a day (BID) | ORAL | 1 refills | Status: DC

## 2022-05-30 NOTE — Patient Instructions (Addendum)
Start metformin 500 mg once daily for one week, if tolerating well increase to 500 mg twice daily.   At follow up visit, come fasting for labs to repeat. Black coffee and water only.   Due to recent changes in healthcare laws, you may see results of your imaging and/or laboratory studies on MyChart before I have had a chance to review them.  I understand that in some cases there may be results that are confusing or concerning to you. Please understand that not all results are received at the same time and often I may need to interpret multiple results in order to provide you with the best plan of care or course of treatment. Therefore, I ask that you please give me 2 business days to thoroughly review all your results before contacting my office for clarification. Should we see a critical lab result, you will be contacted sooner.   It was a pleasure seeing you today! Please do not hesitate to reach out with any questions and or concerns.  Regards,   [name] FNP-C

## 2022-05-30 NOTE — Progress Notes (Signed)
MyChart Video Visit    Virtual Visit via Video Note   This visit type was conducted due to national recommendations for restrictions regarding the COVID-19 Pandemic (e.g. social distancing) in an effort to limit this patient's exposure and mitigate transmission in our community. This patient is at least at moderate risk for complications without adequate follow up. This format is felt to be most appropriate for this patient at this time. Physical exam was limited by quality of the video and audio technology used for the visit. CMA was able to get the patient set up on a video visit.  Patient location: Home. Patient and provider in visit Provider location: Office  I discussed the limitations of evaluation and management by telemedicine and the availability of in person appointments. The patient expressed understanding and agreed to proceed.  Visit Date: 05/30/2022  Today's healthcare provider: [name], FNP     Subjective:    Patient ID: Brenda Chavez, female    DOB: [birth year], 61 y.o.   MRN: [id number]  Chief Complaint  Patient presents with   Diabetes    Diabetes    Pt here today via video visit with concerns.   DM2: has started walking exercises and trying some weight bearing exercises. Has been trying to pay more attention to there diet.  Lab Results  Component Value Date   HGBA1C 6.7 (H) 05/22/2022   Microalbumin: recently started on losartan 25 mg once daily, tolerating well.   HLD: continue atorvastatin 40 mg , numbers within range. Doing well. Working on low cholesterol diet as able.  Lab Results  Component Value Date   CHOL 128 05/22/2022   HDL 67.20 05/22/2022   LDLCALC 35 05/22/2022   TRIG 131.0 05/22/2022   CHOLHDL 2 05/22/2022     Past Medical History:  Diagnosis Date   Abnormal Pap smear of cervix    Anxiety    CAD (coronary artery disease) cardiologist--- dr Angelena Form   a. 04/2015 NSTEMI/PCI: LM nl, LAD 30d, D1 100 (2.25x12 Promus Premier  DES), D2 small, RI small, LCX nl, OM1/2 nl, RCA/PDA nl, EF 45-50%.   Diabetes mellitus type 2, diet-controlled ([place])    followed by pcp--- newly dx 03/ 2022   Family history of adverse reaction to anesthesia    mother-- ponv   GERD (gastroesophageal reflux disease)    Glaucoma, both eyes    History of non-ST elevation myocardial infarction (NSTEMI) 05/14/2015   s/p  PCI and DES   Hyperlipidemia    Osteoporosis    S/P drug eluting coronary stent placement 05/14/2015   DES x1 to D1   Wears glasses     Past Surgical History:  Procedure Laterality Date   CARDIAC CATHETERIZATION N/A 05/14/2015   Procedure: Left Heart Cath and Coronary Angiography;  Surgeon: Burnell Blanks, MD;  Location: [facility];  Service: Cardiovascular;  Laterality: N/A;   CARDIAC CATHETERIZATION N/A 05/14/2015   Procedure: Coronary Stent Intervention;  Surgeon: Burnell Blanks, MD;  Location: [facility];  Service: Cardiovascular;  Laterality: N/A;   CERVICAL CONIZATION W/BX N/A 02/09/2021   Procedure: COLD KNIFE CONIZATION CERVIX;  Surgeon: Lafonda Mosses, MD;  Location: [facility];  Service: Gynecology;  Laterality: N/A;   DILATION AND CURETTAGE OF UTERUS  2021   HYSTEROSCOPY WITH D & C N/A 02/09/2021   Procedure: DILATATION AND CURETTAGE /HYSTEROSCOPY WITH MYOSURE, POLYPECTOMY;  Surgeon: Lafonda Mosses, MD;  Location: [facility];  Service:  Gynecology;  Laterality: N/A;   WISDOM TOOTH EXTRACTION  1984    Family History  Problem Relation Age of Onset   Breast cancer Cousin 109   Heart disease Mother    Heart disease Father    COPD Father    Heart attack Maternal Grandmother    Heart attack Paternal Grandmother    Breast cancer Maternal Aunt 64   Diabetes Maternal Aunt    Heart disease Paternal Aunt    Colon cancer Cousin    Breast cancer Cousin    Cancer Other    Hypertension Other    Ovarian cancer Neg Hx    Pancreatic cancer Neg Hx     Endometrial cancer Neg Hx     Social History   Socioeconomic History   Marital status: Married    Spouse name: Not on file   Number of children: 0   Years of education: Not on file   Highest education level: Not on file  Occupational History   Occupation: retired  Tobacco Use   Smoking status: Never   Smokeless tobacco: Never  Vaping Use   Vaping Use: Never used  Substance and Sexual Activity   Alcohol use: No   Drug use: Never   Sexual activity: Yes    Partners: Male    Birth control/protection: Post-menopausal  Other Topics Concern   Not on file  Social History Narrative   Not on file   Social Determinants of Health   Financial Resource Strain: Not on file  Food Insecurity: Not on file  Transportation Needs: Not on file  Physical Activity: Not on file  Stress: Not on file  Social Connections: Not on file  Intimate Partner Violence: Not on file    Outpatient Medications Prior to Visit  Medication Sig Dispense Refill   aspirin EC 81 MG tablet Take 1 tablet (81 mg total) by mouth daily. 90 tablet 3   atorvastatin (LIPITOR) 40 MG tablet TAKE 1 TABLET DAILY (KEEP UPCOMING APPOINTMENT FOR FURTHER REFILLS) 90 tablet 3   azelastine (ASTELIN) 0.1 % nasal spray Place 2 sprays into both nostrils 2 (two) times daily. Use in each nostril as directed 30 mL 12   Blood Glucose Monitoring Suppl (FREESTYLE LITE) w/Device KIT 1 Device by Does not apply route daily. 1 kit 0   cholecalciferol (VITAMIN D) 1000 UNITS tablet Take 1,000 Units by mouth daily.     esomeprazole (NEXIUM) 20 MG capsule Take 20 mg by mouth daily.     glucose blood (FREESTYLE TEST STRIPS) test strip Use as instructed 100 each 12   Lancets (FREESTYLE) lancets Use as instructed 100 each 12   latanoprost (XALATAN) 0.005 % ophthalmic solution Place 1 drop into both eyes at bedtime.     losartan (COZAAR) 25 MG tablet Take 1 tablet (25 mg total) by mouth daily. 90 tablet 1   MAGNESIUM CITRATE PO Take 250 mg by  mouth daily.     metoprolol tartrate (LOPRESSOR) 25 MG tablet TAKE ONE-HALF (1/2) TABLET TWICE A DAY 90 tablet 0   nitroGLYCERIN (NITROSTAT) 0.4 MG SL tablet Place 1 tablet (0.4 mg total) under the tongue every 5 (five) minutes x 3 doses as needed for chest pain. 25 tablet 6   No facility-administered medications prior to visit.    Allergies  Allergen Reactions   Erythromycin Nausea And Vomiting    Ended up in hospital needing IVF     Macrobid [Nitrofurantoin Macrocrystal] Nausea And Vomiting    Ended up in hospital  needing IVF     Floxin [Ofloxacin] Other (See Comments)    Unable to close eyes         Objective:    Physical Exam Constitutional:      General: She is not in acute distress.    Appearance: Normal appearance. She is not ill-appearing or toxic-appearing.  Pulmonary:     Effort: Pulmonary effort is normal.  Neurological:     General: No focal deficit present.     Mental Status: She is alert and oriented to person, place, and time. Mental status is at baseline.  Psychiatric:        Mood and Affect: Mood normal.        Behavior: Behavior normal.        Thought Content: Thought content normal.        Judgment: Judgment normal.     There were no vitals taken for this visit. Wt Readings from Last 3 Encounters:  05/22/22 150 lb (68 kg)  09/01/21 152 lb 9.6 oz (69.2 kg)  03/09/21 147 lb 3.2 oz (66.8 kg)       Assessment & Plan:   Problem List Items Addressed This Visit       Endocrine   Type 2 diabetes mellitus without complication, without long-term current use of insulin (HCC)    D/w pt to start metformin 500 mg po qd for one week, if tolerating well increase to metformin 500 mg bid.  Work on diabetic diet and exercise as tolerated. Yearly foot exam, and annual eye exam.  Repeat hga1c in three months.       Relevant Medications   metFORMIN (GLUCOPHAGE) 500 MG tablet     Other   Hyperlipidemia LDL goal <70 - Primary    Continue atorvastatin 40 mg  once daily. Work on low cholesterol diet and exercise as tolerated        I am having Brenda Chavez start on metFORMIN. I am also having her maintain her cholecalciferol, aspirin EC, MAGNESIUM CITRATE PO, nitroGLYCERIN, esomeprazole, latanoprost, freestyle, FREESTYLE TEST STRIPS, FreeStyle Lite, atorvastatin, metoprolol tartrate, azelastine, and losartan.  Meds ordered this encounter  Medications   metFORMIN (GLUCOPHAGE) 500 MG tablet    Sig: Take 1 tablet (500 mg total) by mouth 2 (two) times daily with a meal.    Dispense:  180 tablet    Refill:  1    Order Specific Question:   Supervising Provider    Answer:   BEDSOLE, AMY E [[id number]]    I discussed the assessment and treatment plan with the patient. The patient was provided an opportunity to ask questions and all were answered. The patient agreed with the plan and demonstrated an understanding of the instructions.   The patient was advised to call back or seek an in-person evaluation if the symptoms worsen or if the condition fails to improve as anticipated.  I provided 15 minutes of face-to-face time during this encounter.   [name], [place] [facility] [place] [phone] (phone) [phone] (fax)  [place]

## 2022-05-30 NOTE — Patient Instructions (Signed)
Medication Instructions:  Your physician recommends that you continue on your current medications as directed. Please refer to the Current Medication list given to you today.  *If you need a refill on your cardiac medications before your next appointment, please call your pharmacy*   Lab Work: none If you have labs (blood work) drawn today and your tests are completely normal, you will receive your results only by: [place] (if you have MyChart) OR A paper copy in the mail If you have any lab test that is abnormal or we need to change your treatment, we will call you to review the results.   Testing/Procedures: none   Follow-Up: At [facility], you and your health needs are our priority.  As part of our continuing mission to provide you with exceptional heart care, we have created designated Provider Care Teams.  These Care Teams include your primary Cardiologist (physician) and Advanced Practice Providers (APPs -  Physician Assistants and Nurse Practitioners) who all work together to provide you with the care you need, when you need it.  We recommend signing up for the patient portal called "MyChart".  Sign up information is provided on this After Visit Summary.  MyChart is used to connect with patients for Virtual Visits (Telemedicine).  Patients are able to view lab/test results, encounter notes, upcoming appointments, etc.  Non-urgent messages can be sent to your provider as well.   To learn more about what you can do with MyChart, go to [email or web address].    Your next appointment:   12 month(s)  The format for your next appointment:   In Person  Provider:   Dr Angelena Form    Other [place]

## 2022-05-30 NOTE — Assessment & Plan Note (Signed)
Continue atorvastatin 40 mg once daily. Work on low cholesterol diet and exercise as tolerated

## 2022-05-30 NOTE — Progress Notes (Signed)
'  Chief Complaint  Patient presents with   Follow-up    CAD   History of Present Illness: 61 yo female with history of CAD and HLD here today for cardiac follow up. She was admitted to [facility] August 2016 with a NSTEMI and found to have a severe stenosis in the Diagonal branch treated with a drug eluting stent. Echo March 2021 with normal LV function, no valve disease. She had a possible occular stroke in 2022. Carotid artery dopplers May 2022 were normal.    She is here today for follow up. The patient denies any chest pain, dyspnea, palpitations, lower extremity edema, orthopnea, PND, dizziness, near syncope or syncope.   Primary Care Physician: [name], FNP  Past Medical History:  Diagnosis Date   Abnormal Pap smear of cervix    Anxiety    CAD (coronary artery disease) cardiologist--- dr Angelena Form   a. 04/2015 NSTEMI/PCI: LM nl, LAD 30d, D1 100 (2.25x12 Promus Premier DES), D2 small, RI small, LCX nl, OM1/2 nl, RCA/PDA nl, EF 45-50%.   Diabetes mellitus type 2, diet-controlled ([place])    followed by pcp--- newly dx 03/ 2022   Family history of adverse reaction to anesthesia    mother-- ponv   GERD (gastroesophageal reflux disease)    Glaucoma, both eyes    History of non-ST elevation myocardial infarction (NSTEMI) 05/14/2015   s/p  PCI and DES   Hyperlipidemia    Osteoporosis    S/P drug eluting coronary stent placement 05/14/2015   DES x1 to D1   Wears glasses     Past Surgical History:  Procedure Laterality Date   CARDIAC CATHETERIZATION N/A 05/14/2015   Procedure: Left Heart Cath and Coronary Angiography;  Surgeon: Burnell Blanks, MD;  Location: [place] [facility];  Service: Cardiovascular;  Laterality: N/A;   CARDIAC CATHETERIZATION N/A 05/14/2015   Procedure: Coronary Stent Intervention;  Surgeon: Burnell Blanks, MD;  Location: [place] [facility];  Service: Cardiovascular;  Laterality: N/A;   CERVICAL CONIZATION W/BX N/A 02/09/2021   Procedure: COLD  KNIFE CONIZATION CERVIX;  Surgeon: Lafonda Mosses, MD;  Location: [facility];  Service: Gynecology;  Laterality: N/A;   DILATION AND CURETTAGE OF UTERUS  2021   HYSTEROSCOPY WITH D & C N/A 02/09/2021   Procedure: DILATATION AND CURETTAGE /HYSTEROSCOPY WITH MYOSURE, POLYPECTOMY;  Surgeon: Lafonda Mosses, MD;  Location: [place];  Service: Gynecology;  Laterality: N/A;   WISDOM TOOTH EXTRACTION  1984   Current Medications:   Current Outpatient Medications:  Current Meds  Medication Sig   aspirin EC 81 MG tablet Take 1 tablet (81 mg total) by mouth daily.   azelastine (ASTELIN) 0.1 % nasal spray Place 2 sprays into both nostrils 2 (two) times daily. Use in each nostril as directed   Blood Glucose Monitoring Suppl (FREESTYLE LITE) w/Device KIT 1 Device by Does not apply route daily.   cholecalciferol (VITAMIN D) 1000 UNITS tablet Take 1,000 Units by mouth daily.   esomeprazole (NEXIUM) 20 MG capsule Take 20 mg by mouth daily.   glucose blood (FREESTYLE TEST STRIPS) test strip Use as instructed   Lancets (FREESTYLE) lancets Use as instructed   latanoprost (XALATAN) 0.005 % ophthalmic solution Place 1 drop into both eyes at bedtime.   losartan (COZAAR) 25 MG tablet Take 1 tablet (25 mg total) by mouth daily.   MAGNESIUM CITRATE PO Take 250 mg by mouth daily.   metFORMIN (GLUCOPHAGE) 500 MG tablet Take 1  tablet (500 mg total) by mouth 2 (two) times daily with a meal.   [DISCONTINUED] atorvastatin (LIPITOR) 40 MG tablet TAKE 1 TABLET DAILY (KEEP UPCOMING APPOINTMENT FOR FURTHER REFILLS)   [DISCONTINUED] metoprolol tartrate (LOPRESSOR) 25 MG tablet TAKE ONE-HALF (1/2) TABLET TWICE A DAY   [DISCONTINUED] nitroGLYCERIN (NITROSTAT) 0.4 MG SL tablet Place 1 tablet (0.4 mg total) under the tongue every 5 (five) minutes x 3 doses as needed for chest pain.    Allergies  Allergen Reactions   Erythromycin Nausea And Vomiting    Ended up in hospital needing IVF      Macrobid [Nitrofurantoin Macrocrystal] Nausea And Vomiting    Ended up in hospital needing IVF     Floxin [Ofloxacin] Other (See Comments)    Unable to close eyes    Social History   Socioeconomic History   Marital status: Married    Spouse name: Not on file   Number of children: 0   Years of education: Not on file   Highest education level: Not on file  Occupational History   Occupation: retired  Tobacco Use   Smoking status: Never   Smokeless tobacco: Never  Vaping Use   Vaping Use: Never used  Substance and Sexual Activity   Alcohol use: No   Drug use: Never   Sexual activity: Yes    Partners: Male    Birth control/protection: Post-menopausal  Other Topics Concern   Not on file  Social History Narrative   Not on file   Social Determinants of Health   Financial Resource Strain: Not on file  Food Insecurity: Not on file  Transportation Needs: Not on file  Physical Activity: Not on file  Stress: Not on file  Social Connections: Not on file  Intimate Partner Violence: Not on file    Family History  Problem Relation Age of Onset   Breast cancer Cousin 32   Heart disease Mother    Heart disease Father    COPD Father    Heart attack Maternal Grandmother    Heart attack Paternal Grandmother    Breast cancer Maternal Aunt 49   Diabetes Maternal Aunt    Heart disease Paternal Aunt    Colon cancer Cousin    Breast cancer Cousin    Cancer Other    Hypertension Other    Ovarian cancer Neg Hx    Pancreatic cancer Neg Hx    Endometrial cancer Neg Hx     Review of Systems:  As stated in the HPI and otherwise negative.   BP 122/72   Pulse (!) 59   Ht '5\' 4"'  (1.626 m)   Wt 149 lb 12.8 oz (67.9 kg)   SpO2 98%   BMI 25.71 kg/m   Physical Examination: General: Well developed, well nourished, NAD  HEENT: OP clear, mucus membranes moist  SKIN: warm, dry. No rashes. Neuro: No focal deficits  Musculoskeletal: Muscle strength 5/5 all ext  Psychiatric:  Mood and affect normal  Neck: No JVD, no carotid bruits, no thyromegaly, no lymphadenopathy.  Lungs:Clear bilaterally, no wheezes, rhonci, crackles Cardiovascular: Regular rate and rhythm. No murmurs, gallops or rubs. Abdomen:Soft. Bowel sounds present. Non-tender.  Extremities: No lower extremity edema. Pulses are 2 + in the bilateral DP/PT.  Cardiac cath 05/14/15: Left Anterior Descending  The vessel is large .    Dist LAD lesion, 30% stenosed. discrete .    First Diagonal Branch   The vessel is moderate in size.    Ost 1st Diag to 1st  Diag lesion, 100% stenosed. thrombotic .    PCI: There is no pre-interventional antegrade distal flow (TIMI 0). Pre-stent angioplasty was performed. A drug-eluting stent was placed. The strut is apposed. Post-stent angioplasty was performed. The post-interventional distal flow is normal (TIMI 3). The intervention was successful. No complications occurred at this lesion.   There is no residual stenosis post intervention.      Second Diagonal Branch   The vessel is small in size.       Ramus Intermedius  The vessel is small .      Left Circumflex  The vessel is large is angiographically normal.    First Obtuse Marginal Branch   The vessel is small in size.    Second Obtuse Marginal Branch   The vessel is moderate in size.      Right Coronary Artery  The vessel is moderate in size is angiographically normal.    Right Posterior Descending Artery   The vessel is moderate in size and is angiographically normal.     Echo March 2021: . 1. Left ventricular ejection fraction, by estimation, is 60 to 65%. The  left ventricle has normal function. The left ventricle has no regional  wall motion abnormalities. Left ventricular diastolic parameters are  consistent with Grade II diastolic  dysfunction (pseudonormalization). The average left ventricular global  longitudinal strain is -21.0 %.   2. Right ventricular systolic function is normal. The right  ventricular  size is normal. There is normal pulmonary artery systolic pressure.   3. The mitral valve is normal in structure. No evidence of mitral valve  regurgitation. No evidence of mitral stenosis.   4. The aortic valve is normal in structure. Aortic valve regurgitation is  not visualized. No aortic stenosis is present.   5. The inferior vena cava is normal in size with greater than 50%  respiratory variability, suggesting right atrial pressure of 3 mmHg.   EKG:  EKG is ordered today. The ekg ordered today demonstrates Sinus brady, rate 59 bpm  Recent Labs: 09/01/2021: ALT 39; Hemoglobin 14.7; Platelets 291 05/22/2022: BUN 19; Creatinine, Ser 0.89; Potassium 4.5; Sodium 141   Lipid Panel    Component Value Date/Time   CHOL 128 05/22/2022 1127   CHOL 127 01/28/2020 0947   TRIG 131.0 05/22/2022 1127   HDL 67.20 05/22/2022 1127   HDL 58 01/28/2020 0947   CHOLHDL 2 05/22/2022 1127   VLDL 26.2 05/22/2022 1127   LDLCALC 35 05/22/2022 1127   LDLCALC 55 09/01/2021 0848     Wt Readings from Last 3 Encounters:  05/30/22 149 lb 12.8 oz (67.9 kg)  05/22/22 150 lb (68 kg)  09/01/21 152 lb 9.6 oz (69.2 kg)     Assessment and Plan:   1.  CAD without angina: She had a NSTEMI in August 2016 and had a DES placed in the Diagonal branch. No chest pain. Continue ASA, statin and beta blocker.     2. Hyperlipidemia: LDL at goal in August 2023. Continue statin.    Labs/ tests ordered today include:   Orders Placed This Encounter  Procedures   EKG 12-Lead   Disposition:   F/U with me in 12 months  Signed, Lauree Chandler, MD 05/30/2022 3:50 PM    [facility] [address], [place], [place]  [address] Phone: [phone]; Fax: [phone]

## 2022-05-30 NOTE — Assessment & Plan Note (Signed)
D/w pt to start metformin 500 mg po qd for one week, if tolerating well increase to metformin 500 mg bid.  Work on diabetic diet and exercise as tolerated. Yearly foot exam, and annual eye exam.  Repeat hga1c in three months.

## 2022-06-14 ENCOUNTER — Ambulatory Visit: Payer: 59 | Admitting: Cardiovascular Disease

## 2022-06-15 ENCOUNTER — Other Ambulatory Visit: Payer: Self-pay

## 2022-06-15 ENCOUNTER — Telehealth: Payer: Self-pay | Admitting: Cardiovascular Disease

## 2022-06-15 DIAGNOSIS — R809 Proteinuria, unspecified: Secondary | ICD-10-CM

## 2022-06-15 MED ORDER — LOSARTAN POTASSIUM 25 MG PO TABS: 25.0000 mg | ORAL_TABLET | Freq: Every day | ORAL | 1 refills | Status: DC

## 2022-06-15 NOTE — Telephone Encounter (Signed)
   Pt c/o medication issue:  1. Name of Medication: metoprolol tartrate (LOPRESSOR) 25 MG tablet losartan (COZAAR) 25 MG tablet  2. How are you currently taking this medication (dosage and times per day)? As written  3. Are you having a reaction (difficulty breathing--STAT)? No   4. What is your medication issue? Pt said, Dr. Angelena Form prescribed her metoprolol and her pcp prescribed losartan. She said both of this is for to help with her BP, she's making sure she is not taking too much BP meds. Losartan was prescribed for her because she has diabetes

## 2022-06-15 NOTE — Telephone Encounter (Signed)
Adv patient that she should be fine to take metoprolol 12.5 mg bid and losartan 25 mg daily.  Adv she could take losartan at bedtime if she feels fatigued on it during the day.  Pt voices understanding and appreciation for guidance.

## 2022-06-26 ENCOUNTER — Ambulatory Visit: Payer: 59 | Admitting: Cardiovascular Disease

## 2022-06-30 ENCOUNTER — Telehealth: Payer: Self-pay | Admitting: Family

## 2022-06-30 ENCOUNTER — Other Ambulatory Visit: Payer: Self-pay

## 2022-06-30 DIAGNOSIS — E119 Type 2 diabetes mellitus without complications: Secondary | ICD-10-CM

## 2022-06-30 MED ORDER — METFORMIN HCL 500 MG PO TABS: 500.0000 mg | ORAL_TABLET | Freq: Two times a day (BID) | ORAL | 1 refills | Status: DC

## 2022-06-30 NOTE — Telephone Encounter (Signed)
Sent to [facility] as a medication refill.

## 2022-06-30 NOTE — Telephone Encounter (Signed)
Pt stated [facility], Mail order sent over a request for a refill for metFORMIN (GLUCOPHAGE) 500 MG tablet & stated they haven't got a response yet. Pt was wondering could her prescription get refilled soon due to her almost being out of meds? Call back # [phone]

## 2022-07-05 ENCOUNTER — Ambulatory Visit: Payer: 59 | Admitting: Cardiovascular Disease

## 2022-07-10 ENCOUNTER — Telehealth: Payer: Self-pay | Admitting: Family

## 2022-07-10 NOTE — Telephone Encounter (Signed)
Sorry Brenda Chavez,I totally meant to send it to her!!!

## 2022-07-10 NOTE — Telephone Encounter (Signed)
Not sure Amy this might be in your court. Video visit is charged as an office visit last I checked?

## 2022-07-10 NOTE — Telephone Encounter (Signed)
Patient called in stating that she already spoke to billing regarding being billed for her mychart video visit on 05/30/2022. She stated that she wasn't aware that she would be charged another 240 because all she did was ask 2 questions and she thought it would just be a follow up from her 8/28/ appointment with tabitha,she would like to get more clarification as to why she was billed.

## 2022-07-11 LAB — HM DIABETES EYE EXAM

## 2022-07-17 NOTE — Telephone Encounter (Signed)
Pt called in requesting  a call back from a manager to get clarification on her bill . Please Advise # [phone]

## 2022-07-25 NOTE — Telephone Encounter (Signed)
Patient called back in following up on billing questions.

## 2022-08-29 ENCOUNTER — Ambulatory Visit (INDEPENDENT_AMBULATORY_CARE_PROVIDER_SITE_OTHER): Payer: 59 | Admitting: Family

## 2022-08-29 ENCOUNTER — Encounter: Payer: Self-pay | Admitting: Family

## 2022-08-29 VITALS — BP 124/72 | HR 61 | Temp 98.2°F | Resp 16 | Ht 64.0 in | Wt 141.2 lb

## 2022-08-29 DIAGNOSIS — J301 Allergic rhinitis due to pollen: Secondary | ICD-10-CM | POA: Diagnosis not present

## 2022-08-29 DIAGNOSIS — K219 Gastro-esophageal reflux disease without esophagitis: Secondary | ICD-10-CM

## 2022-08-29 DIAGNOSIS — E119 Type 2 diabetes mellitus without complications: Secondary | ICD-10-CM | POA: Diagnosis not present

## 2022-08-29 LAB — POCT GLYCOSYLATED HEMOGLOBIN (HGB A1C): Hemoglobin A1C: 5.6 % (ref 4.0–5.6)

## 2022-08-29 MED ORDER — METFORMIN HCL ER 500 MG PO TB24: 500.0000 mg | ORAL_TABLET | Freq: Every day | ORAL | 0 refills | Status: DC

## 2022-08-29 NOTE — Assessment & Plan Note (Signed)
A1c today in office  Stop metformin start XR metformin 500 mg once daily  Give it two weeks, if still bloated and with Gi upset, stop and we will determine if another med is needed.

## 2022-08-29 NOTE — Assessment & Plan Note (Signed)
Stable without ppi.  Try to decrease and or avoid spicy foods, fried fatty foods, and also caffeine and chocolate as these can increase heartburn symptoms.

## 2022-08-29 NOTE — Progress Notes (Signed)
Established Patient Office Visit  Subjective:  Patient ID: Brenda Chavez, female    DOB: Jan 03, 1961  Age: 61 y.o. MRN: [id number]  CC:  Chief Complaint  Patient presents with   Diabetes    HPI Brenda Chavez is here today for follow up.   Pt is with acute concerns.  DM2: started metformin back in August. Had an upset stomach, now is noticing 1-2 times a week feels constant stomach bloat and uneasy feeling. Only taking metformin 500 mg once daily.   Lab Results  Component Value Date   HGBA1C 6.7 (H) 05/22/2022   GERD: stopped her nexium and has not had any change in heart burn symptoms. Doesn't bother her near as often anymore.   Wt Readings from Last 3 Encounters:  08/29/22 141 lb 4 oz (64.1 kg)  05/30/22 149 lb 12.8 oz (67.9 kg)  05/22/22 150 lb (68 kg)   Allergic rhinitis: tried astelin for a month without any relief. Also tried flonase without much relief.   Positive urine microalbumin, 2.1 elevated three months ago. Pt on losartan.   Past Medical History:  Diagnosis Date   Abnormal Pap smear of cervix    Anxiety    CAD (coronary artery disease) cardiologist--- dr Angelena Form   a. 04/2015 NSTEMI/PCI: LM nl, LAD 30d, D1 100 (2.25x12 Promus Premier DES), D2 small, RI small, LCX nl, OM1/2 nl, RCA/PDA nl, EF 45-50%.   Diabetes mellitus type 2, diet-controlled ([place])    followed by pcp--- newly dx 03/ 2022   Family history of adverse reaction to anesthesia    mother-- ponv   GERD (gastroesophageal reflux disease)    Glaucoma, both eyes    History of non-ST elevation myocardial infarction (NSTEMI) 05/14/2015   s/p  PCI and DES   Hyperlipidemia    Osteoporosis    S/P drug eluting coronary stent placement 05/14/2015   DES x1 to D1   Wears glasses     Past Surgical History:  Procedure Laterality Date   CARDIAC CATHETERIZATION N/A 05/14/2015   Procedure: Left Heart Cath and Coronary Angiography;  Surgeon: Burnell Blanks, MD;  Location: [place] [facility];  Service:  Cardiovascular;  Laterality: N/A;   CARDIAC CATHETERIZATION N/A 05/14/2015   Procedure: Coronary Stent Intervention;  Surgeon: Burnell Blanks, MD;  Location: [place] [facility];  Service: Cardiovascular;  Laterality: N/A;   CERVICAL CONIZATION W/BX N/A 02/09/2021   Procedure: COLD KNIFE CONIZATION CERVIX;  Surgeon: Lafonda Mosses, MD;  Location: [facility];  Service: Gynecology;  Laterality: N/A;   DILATION AND CURETTAGE OF UTERUS  2021   HYSTEROSCOPY WITH D & C N/A 02/09/2021   Procedure: DILATATION AND CURETTAGE /HYSTEROSCOPY WITH MYOSURE, POLYPECTOMY;  Surgeon: Lafonda Mosses, MD;  Location: [place];  Service: Gynecology;  Laterality: N/A;   WISDOM TOOTH EXTRACTION  1984    Family History  Problem Relation Age of Onset   Breast cancer Cousin 71   Heart disease Mother    Heart disease Father    COPD Father    Heart attack Maternal Grandmother    Heart attack Paternal Grandmother    Breast cancer Maternal Aunt 98   Diabetes Maternal Aunt    Heart disease Paternal Aunt    Colon cancer Cousin    Breast cancer Cousin    Cancer Other    Hypertension Other    Ovarian cancer Neg Hx    Pancreatic cancer Neg Hx    Endometrial cancer  Neg Hx     Social History   Socioeconomic History   Marital status: Married    Spouse name: Not on file   Number of children: 0   Years of education: Not on file   Highest education level: Not on file  Occupational History   Occupation: retired  Tobacco Use   Smoking status: Never   Smokeless tobacco: Never  Vaping Use   Vaping Use: Never used  Substance and Sexual Activity   Alcohol use: No   Drug use: Never   Sexual activity: Yes    Partners: Male    Birth control/protection: Post-menopausal  Other Topics Concern   Not on file  Social History Narrative   Not on file   Social Determinants of Health   Financial Resource Strain: Not on file  Food Insecurity: Not on file  Transportation  Needs: Not on file  Physical Activity: Not on file  Stress: Not on file  Social Connections: Not on file  Intimate Partner Violence: Not on file    Outpatient Medications Prior to Visit  Medication Sig Dispense Refill   aspirin EC 81 MG tablet Take 1 tablet (81 mg total) by mouth daily. 90 tablet 3   atorvastatin (LIPITOR) 40 MG tablet TAKE 1 TABLET DAILY 90 tablet 3   Blood Glucose Monitoring Suppl (FREESTYLE LITE) w/Device KIT 1 Device by Does not apply route daily. 1 kit 0   cholecalciferol (VITAMIN D) 1000 UNITS tablet Take 1,000 Units by mouth daily.     glucose blood (FREESTYLE TEST STRIPS) test strip Use as instructed 100 each 12   Lancets (FREESTYLE) lancets Use as instructed 100 each 12   latanoprost (XALATAN) 0.005 % ophthalmic solution Place 1 drop into both eyes at bedtime.     losartan (COZAAR) 25 MG tablet Take 1 tablet (25 mg total) by mouth daily. 90 tablet 1   MAGNESIUM CITRATE PO Take 250 mg by mouth daily.     metoprolol tartrate (LOPRESSOR) 25 MG tablet TAKE ONE-HALF (1/2) TABLET TWICE A DAY 90 tablet 3   nitroGLYCERIN (NITROSTAT) 0.4 MG SL tablet Place 1 tablet (0.4 mg total) under the tongue every 5 (five) minutes x 3 doses as needed for chest pain. 25 tablet 6   metFORMIN (GLUCOPHAGE) 500 MG tablet Take 1 tablet (500 mg total) by mouth 2 (two) times daily with a meal. 180 tablet 1   azelastine (ASTELIN) 0.1 % nasal spray Place 2 sprays into both nostrils 2 (two) times daily. Use in each nostril as directed 30 mL 12   esomeprazole (NEXIUM) 20 MG capsule Take 20 mg by mouth daily.     No facility-administered medications prior to visit.    Allergies  Allergen Reactions   Erythromycin Nausea And Vomiting    Ended up in hospital needing IVF     Macrobid [Nitrofurantoin Macrocrystal] Nausea And Vomiting    Ended up in hospital needing IVF     Floxin [Ofloxacin] Other (See Comments)    Unable to close eyes   Metformin And Related Other (See Comments)    Abd  bloat and diarrhea          Objective:    Physical Exam Constitutional:      Appearance: Normal appearance. She is obese.  Cardiovascular:     Rate and Rhythm: Normal rate and regular rhythm.  Pulmonary:     Effort: Pulmonary effort is normal.     Breath sounds: Normal breath sounds.  Abdominal:  General: Abdomen is flat.     Palpations: There is no mass.     Tenderness: There is no abdominal tenderness. There is no guarding.  Neurological:     General: No focal deficit present.     Mental Status: She is alert and oriented to person, place, and time. Mental status is at baseline.  Psychiatric:        Mood and Affect: Mood normal.        Behavior: Behavior normal.        Thought Content: Thought content normal.        Judgment: Judgment normal.      BP 124/72   Pulse 61   Temp 98.2 F (36.8 C)   Resp 16   Ht _0  (1.626 m)   Wt 141 lb 4 oz (64.1 kg)   SpO2 94%   BMI 24.25 kg/m  Wt Readings from Last 3 Encounters:  08/29/22 141 lb 4 oz (64.1 kg)  05/30/22 149 lb 12.8 oz (67.9 kg)  05/22/22 150 lb (68 kg)     Health Maintenance Due  Topic Date Due   Zoster Vaccines- Shingrix (1 of 2) Never done   FOOT EXAM  02/25/2022   INFLUENZA VACCINE  04/25/2022   COVID-19 Vaccine (4 - 2023-24 season) 05/26/2022    There are no preventive care reminders to display for this patient.  Lab Results  Component Value Date   TSH 2.251 05/14/2015   Lab Results  Component Value Date   WBC 6.4 09/01/2021   HGB 14.7 09/01/2021   HCT 44.6 09/01/2021   MCV 91.8 09/01/2021   PLT 291 09/01/2021   Lab Results  Component Value Date   NA 141 05/22/2022   K 4.5 05/22/2022   CO2 28 05/22/2022   GLUCOSE 114 (H) 05/22/2022   BUN 19 05/22/2022   CREATININE 0.89 05/22/2022   BILITOT 0.4 09/01/2021   ALKPHOS 98 01/28/2020   AST 26 09/01/2021   ALT 39 (H) 09/01/2021   PROT 7.3 09/01/2021   ALBUMIN 4.4 01/28/2020   CALCIUM 9.9 05/22/2022   ANIONGAP 5 02/07/2021    EGFR 74 09/01/2021   GFR 70.69 05/22/2022   Lab Results  Component Value Date   CHOL 128 05/22/2022   Lab Results  Component Value Date   HDL 67.20 05/22/2022   Lab Results  Component Value Date   LDLCALC 35 05/22/2022   Lab Results  Component Value Date   TRIG 131.0 05/22/2022   Lab Results  Component Value Date   CHOLHDL 2 05/22/2022   Lab Results  Component Value Date   HGBA1C 6.7 (H) 05/22/2022      Assessment & Plan:   Problem List Items Addressed This Visit       Respiratory   Seasonal allergic rhinitis due to pollen    For now not successful with trial zyrtec, claritin, astelin or flonase. Pt declines singulair trial.         Digestive   Gastroesophageal reflux disease without esophagitis    Stable without ppi.  Try to decrease and or avoid spicy foods, fried fatty foods, and also caffeine and chocolate as these can increase heartburn symptoms.          Endocrine   Type 2 diabetes mellitus without complication, without long-term current use of insulin (HCC) - Primary    A1c today in office  Stop metformin start XR metformin 500 mg once daily  Give it two weeks, if still bloated and with  Gi upset, stop and we will determine if another med is needed.       Relevant Medications   metFORMIN (GLUCOPHAGE-XR) 500 MG 24 hr tablet   Other Relevant Orders   HgB A1c    Meds ordered this encounter  Medications   metFORMIN (GLUCOPHAGE-XR) 500 MG 24 hr tablet    Sig: Take 1 tablet (500 mg total) by mouth daily with breakfast.    Dispense:  90 tablet    Refill:  0    Bloated on metformin without ER    Follow-up: Return in about 3 months (around 11/28/2022) for f/u diabetes.    [name], FNP

## 2022-08-29 NOTE — Assessment & Plan Note (Signed)
For now not successful with trial zyrtec, claritin, astelin or flonase. Pt declines singulair trial.

## 2022-08-29 NOTE — Patient Instructions (Signed)
Stop metformin 500 mg twice daliy Start metformin XR 500 mg once daily.  If in 1-2 weeks still bloated, please stop and send me a message letting me know so we can see if we need to try another therapy.    Regards,   [name] FNP-C

## 2022-10-03 ENCOUNTER — Other Ambulatory Visit: Payer: Self-pay

## 2022-10-03 DIAGNOSIS — E119 Type 2 diabetes mellitus without complications: Secondary | ICD-10-CM

## 2022-10-03 MED ORDER — METFORMIN HCL ER 500 MG PO TB24: 500.0000 mg | ORAL_TABLET | Freq: Every day | ORAL | 1 refills | Status: DC

## 2022-11-16 ENCOUNTER — Other Ambulatory Visit: Payer: Self-pay | Admitting: Family

## 2022-11-16 DIAGNOSIS — E119 Type 2 diabetes mellitus without complications: Secondary | ICD-10-CM

## 2022-11-21 ENCOUNTER — Other Ambulatory Visit: Payer: Self-pay | Admitting: Family

## 2022-11-21 DIAGNOSIS — R809 Proteinuria, unspecified: Secondary | ICD-10-CM

## 2022-11-21 NOTE — Telephone Encounter (Signed)
Refill request for losartan (COZAAR) 25 MG tablet. LR- 06/15/22 (90 tabs/1 refill); LV- 08/29/22;  NV- 11/28/22

## 2022-11-28 ENCOUNTER — Ambulatory Visit (INDEPENDENT_AMBULATORY_CARE_PROVIDER_SITE_OTHER): Payer: 59 | Admitting: Family

## 2022-11-28 ENCOUNTER — Ambulatory Visit: Payer: 59 | Admitting: Family

## 2022-11-28 ENCOUNTER — Encounter: Payer: Self-pay | Admitting: Family

## 2022-11-28 VITALS — BP 124/62 | HR 56 | Temp 98.6°F | Ht 64.0 in | Wt 136.0 lb

## 2022-11-28 DIAGNOSIS — E119 Type 2 diabetes mellitus without complications: Secondary | ICD-10-CM

## 2022-11-28 DIAGNOSIS — R7401 Elevation of levels of liver transaminase levels: Secondary | ICD-10-CM

## 2022-11-28 DIAGNOSIS — R7989 Other specified abnormal findings of blood chemistry: Secondary | ICD-10-CM

## 2022-11-28 LAB — POCT GLYCOSYLATED HEMOGLOBIN (HGB A1C): Hemoglobin A1C: 5.7 % — AB (ref 4.0–5.6)

## 2022-11-28 MED ORDER — SITAGLIPTIN PHOSPHATE 50 MG PO TABS: 50.0000 mg | ORAL_TABLET | Freq: Every day | ORAL | 3 refills | Status: DC

## 2022-11-28 NOTE — Assessment & Plan Note (Signed)
Will obtain in three months at lab only.

## 2022-11-28 NOTE — Assessment & Plan Note (Signed)
Ordered hga1c , desired range goal has been reached. Advised pt to d/c metformin due to stomach upset, start januvia 50 mg once daily. Rtc for lab only in 3 months for repeat A1c. Work on diabetic diet and exercise as tolerated. Yearly foot exam, and annual eye exam.

## 2022-11-28 NOTE — Progress Notes (Signed)
Established Patient Office Visit  Subjective:      CC:  Chief Complaint  Patient presents with   Diabetes    HPI: Brenda Chavez is a 62 y.o. female presenting on 11/28/2022 for Diabetes . DM2: changed metformin to XR taking with breakfast, a little more tolerable but still with stomach upset 85% of the time. Not checking her fasting glucose which is good. Lab Results  Component Value Date   HGBA1C 5.7 (A) 11/28/2022   Urine microalbumin: positive 05/22/22  Does see cardiologist for h/o MI regularly.   Is on BB, ARB, statin.      Social history:  Relevant past medical, surgical, family and social history reviewed and updated as indicated. Interim medical history since our last visit reviewed.  Allergies and medications reviewed and updated.  DATA REVIEWED: CHART IN EPIC           ROS: Negative unless specifically indicated above in HPI.    Current Outpatient Medications:    aspirin EC 81 MG tablet, Take 1 tablet (81 mg total) by mouth daily., Disp: 90 tablet, Rfl: 3   atorvastatin (LIPITOR) 40 MG tablet, TAKE 1 TABLET DAILY, Disp: 90 tablet, Rfl: 3   Blood Glucose Monitoring Suppl (FREESTYLE LITE) w/Device KIT, 1 Device by Does not apply route daily., Disp: 1 kit, Rfl: 0   cholecalciferol (VITAMIN D) 1000 UNITS tablet, Take 1,000 Units by mouth daily., Disp: , Rfl:    glucose blood (FREESTYLE TEST STRIPS) test strip, Use as instructed, Disp: 100 each, Rfl: 12   Lancets (FREESTYLE) lancets, Use as instructed, Disp: 100 each, Rfl: 12   latanoprost (XALATAN) 0.005 % ophthalmic solution, Place 1 drop into both eyes at bedtime., Disp: , Rfl:    losartan (COZAAR) 25 MG tablet, TAKE 1 TABLET DAILY, Disp: 90 tablet, Rfl: 3   metoprolol tartrate (LOPRESSOR) 25 MG tablet, TAKE ONE-HALF (1/2) TABLET TWICE A DAY, Disp: 90 tablet, Rfl: 3   nitroGLYCERIN (NITROSTAT) 0.4 MG SL tablet, Place 1 tablet (0.4 mg total) under the tongue every 5 (five) minutes x 3 doses as needed  for chest pain., Disp: 25 tablet, Rfl: 6   sitaGLIPtin (JANUVIA) 50 MG tablet, Take 1 tablet (50 mg total) by mouth daily., Disp: 90 tablet, Rfl: 3   timolol (TIMOPTIC) 0.5 % ophthalmic solution, Place 1 drop into both eyes daily., Disp: , Rfl:       Objective:    BP 124/62   Pulse (!) 56   Temp 98.6 F (37 C) (Temporal)   Ht '5\' 4"'$  (1.626 m)   Wt 136 lb (61.7 kg)   SpO2 99%   BMI 23.34 kg/m   Wt Readings from Last 3 Encounters:  11/28/22 136 lb (61.7 kg)  08/29/22 141 lb 4 oz (64.1 kg)  05/30/22 149 lb 12.8 oz (67.9 kg)    Physical Exam  Physical Exam Constitutional:      General: not in acute distress.    Appearance: Normal appearance. normal weight. is not ill-appearing, toxic-appearing or diaphoretic.  Cardiovascular:     Rate and Rhythm: Normal rate.  Pulmonary:     Effort: Pulmonary effort is normal.  Musculoskeletal:        General: Normal range of motion.  Neurological:     General: No focal deficit present.     Mental Status: alert and oriented to person, place, and time. Mental status is at baseline.  Psychiatric:        Mood and Affect: Mood normal.  Behavior: Behavior normal.        Thought Content: Thought content normal.        Judgment: Judgment normal.        Assessment & Plan:  Type 2 diabetes mellitus without complication, without long-term current use of insulin (HCC) Assessment & Plan: Ordered hga1c , desired range goal has been reached. Advised pt to d/c metformin due to stomach upset, start januvia 50 mg once daily. Rtc for lab only in 3 months for repeat A1c. Work on diabetic diet and exercise as tolerated. Yearly foot exam, and annual eye exam.    Orders: -     POCT glycosylated hemoglobin (Hb A1C) -     SITagliptin Phosphate; Take 1 tablet (50 mg total) by mouth daily.  Dispense: 90 tablet; Refill: 3 -     Hemoglobin A1c; Future -     Comprehensive metabolic panel; Future -     Microalbumin / creatinine urine ratio;  Future  Elevated LFTs -     Comprehensive metabolic panel; Future  Elevated ALT measurement Assessment & Plan: Will obtain in three months at lab only.       Return in about 6 months (around 05/31/2023) for f/u diabetes.  Brenda Pancoast, MSN, APRN, FNP-C [place]

## 2022-11-28 NOTE — Patient Instructions (Addendum)
  Start [place] once daily in place of metformin.  Stop metformin.    Regards,   [name] FNP-C

## 2022-11-29 NOTE — Progress Notes (Signed)
Are these automatically abstracted to health maint? Sorry I'm getting duplicates.

## 2023-01-23 ENCOUNTER — Telehealth: Payer: Self-pay | Admitting: Family

## 2023-01-23 DIAGNOSIS — E119 Type 2 diabetes mellitus without complications: Secondary | ICD-10-CM

## 2023-01-23 NOTE — Telephone Encounter (Signed)
Prescription Request  01/23/2023  LOV: 11/28/2022  What is the name of the medication or equipment? sitaGLIPtin (JANUVIA) 50 MG tablet, 90 day supply  Have you contacted your pharmacy to request a refill? Yes   Which pharmacy would you like this sent to? [facility] - [place], [place] - [address] [place] [address] Phone: [phone] Fax: [phone]     Patient notified that their request is being sent to the clinical staff for review and that they should receive a response within 2 business days.   Please advise at Mobile [phone] (mobile)

## 2023-01-23 NOTE — Telephone Encounter (Signed)
Spoke with the patient and advised the pharmacy has refills there for her. She has not reached out to them, as they only gave her 30 day supply in March. I advised the order was written as to dispense 90 tabs. [facility] will call [facility] to fill and call our office back if there are any issues.

## 2023-02-13 ENCOUNTER — Other Ambulatory Visit: Payer: Self-pay | Admitting: Obstetrics and Gynecology

## 2023-02-13 DIAGNOSIS — Z0000 Encounter for general adult medical examination without abnormal findings: Secondary | ICD-10-CM

## 2023-02-13 MED ORDER — SITAGLIPTIN PHOSPHATE 50 MG PO TABS: 50.0000 mg | ORAL_TABLET | Freq: Every day | ORAL | 3 refills | Status: DC

## 2023-02-13 NOTE — Addendum Note (Signed)
Addended by: Mort Sawyers on: 02/13/2023 02:06 PM   Modules accepted: Orders

## 2023-02-13 NOTE — Telephone Encounter (Signed)
Patient called in and stated that in order for her to get a 90 day supply of sitaGLIPtin (JANUVIA) 50 MG tablet it will need to be sent over to [facility] - [place], [place] - [address]. She would like [facility] to [facility] Thank you!

## 2023-02-28 ENCOUNTER — Other Ambulatory Visit (INDEPENDENT_AMBULATORY_CARE_PROVIDER_SITE_OTHER): Payer: 59

## 2023-02-28 ENCOUNTER — Telehealth: Payer: Self-pay | Admitting: Family

## 2023-02-28 DIAGNOSIS — E119 Type 2 diabetes mellitus without complications: Secondary | ICD-10-CM | POA: Diagnosis not present

## 2023-02-28 DIAGNOSIS — R7989 Other specified abnormal findings of blood chemistry: Secondary | ICD-10-CM | POA: Diagnosis not present

## 2023-02-28 DIAGNOSIS — Z7984 Long term (current) use of oral hypoglycemic drugs: Secondary | ICD-10-CM | POA: Diagnosis not present

## 2023-02-28 LAB — COMPREHENSIVE METABOLIC PANEL
ALT: 29 U/L (ref 0–35)
AST: 25 U/L (ref 0–37)
Albumin: 4.6 g/dL (ref 3.5–5.2)
Alkaline Phosphatase: 87 U/L (ref 39–117)
BUN: 21 mg/dL (ref 6–23)
CO2: 23 mEq/L (ref 19–32)
Calcium: 9.7 mg/dL (ref 8.4–10.5)
Chloride: 102 mEq/L (ref 96–112)
Creatinine, Ser: 0.93 mg/dL (ref 0.40–1.20)
GFR: 66.7 mL/min (ref >60.00)
Glucose, Bld: 84 mg/dL (ref 70–99)
Potassium: 4.3 mEq/L (ref 3.5–5.1)
Sodium: 138 mEq/L (ref 135–145)
Total Bilirubin: 0.6 mg/dL (ref 0.2–1.2)
Total Protein: 7.4 g/dL (ref 6.0–8.3)

## 2023-02-28 LAB — MICROALBUMIN / CREATININE URINE RATIO
Creatinine,U: 132 mg/dL
Microalb Creat Ratio: 0.7 mg/g (ref 0.0–30.0)
Microalb, Ur: 0.9 mg/dL (ref 0.0–1.9)

## 2023-02-28 LAB — HEMOGLOBIN A1C: Hgb A1c MFr Bld: 5.9 % (ref 4.6–6.5)

## 2023-02-28 NOTE — Telephone Encounter (Signed)
I am currently just taking kids and younger folks

## 2023-02-28 NOTE — Telephone Encounter (Signed)
Pt asked if she could switch care to Dr. Milinda Antis? Pt states her mom, Robbie Lis, use to be Dr. Royden Purl pt years ago but Dr. Milinda Antis never accepted new pts until now. Pt states she doesn't have a issue with [facility], she just always wanted to be under Dr. Royden Purl care but never got a chance. Is this transfer okay? Call back # [phone]

## 2023-02-28 NOTE — Telephone Encounter (Signed)
Thanks! Pt has been informed.

## 2023-03-01 NOTE — Telephone Encounter (Signed)
Noted  

## 2023-03-12 ENCOUNTER — Telehealth: Payer: Self-pay | Admitting: Family

## 2023-03-12 NOTE — Telephone Encounter (Signed)
[facility] would like to have patients labs from 02/28/2023 sent over to them. They would like to see her potassium levels.  Fax number: [phone] Attn:Gretchen Renaldo Fiddler

## 2023-03-12 NOTE — Telephone Encounter (Signed)
Have listed as patient GYN. Have routed labs from chart as requested.

## 2023-03-19 ENCOUNTER — Encounter: Payer: 59 | Admitting: Family

## 2023-04-10 ENCOUNTER — Ambulatory Visit
Admission: RE | Admit: 2023-04-10 | Discharge: 2023-04-10 | Disposition: A | Discharge status: Home / Self Care | Payer: 59 | Source: Ambulatory Visit | Attending: Obstetrics and Gynecology | Admitting: Obstetrics and Gynecology

## 2023-04-10 DIAGNOSIS — Z0000 Encounter for general adult medical examination without abnormal findings: Secondary | ICD-10-CM

## 2023-04-23 ENCOUNTER — Other Ambulatory Visit: Payer: Self-pay

## 2023-04-23 MED ORDER — METOPROLOL TARTRATE 25 MG PO TABS: ORAL_TABLET | ORAL | 0 refills | Status: DC

## 2023-04-23 NOTE — Telephone Encounter (Signed)
Incoming [facility] requesting refill request of metoprolol tartrate 90 day supply.

## 2023-04-24 ENCOUNTER — Other Ambulatory Visit: Payer: Self-pay | Admitting: *Deleted

## 2023-05-10 ENCOUNTER — Encounter (INDEPENDENT_AMBULATORY_CARE_PROVIDER_SITE_OTHER): Payer: Self-pay

## 2023-05-15 ENCOUNTER — Other Ambulatory Visit: Payer: Self-pay | Admitting: Cardiovascular Disease

## 2023-05-29 ENCOUNTER — Encounter: Payer: Self-pay | Admitting: Family

## 2023-05-29 LAB — HM DIABETES EYE EXAM

## 2023-05-31 ENCOUNTER — Ambulatory Visit (INDEPENDENT_AMBULATORY_CARE_PROVIDER_SITE_OTHER): Payer: 59 | Admitting: Family

## 2023-05-31 ENCOUNTER — Encounter: Payer: Self-pay | Admitting: Family

## 2023-05-31 VITALS — BP 124/80 | HR 78 | Temp 97.8°F | Ht 64.0 in | Wt 132.8 lb

## 2023-05-31 DIAGNOSIS — M8580 Other specified disorders of bone density and structure, unspecified site: Secondary | ICD-10-CM

## 2023-05-31 DIAGNOSIS — E559 Vitamin D deficiency, unspecified: Secondary | ICD-10-CM

## 2023-05-31 DIAGNOSIS — E785 Hyperlipidemia, unspecified: Secondary | ICD-10-CM | POA: Diagnosis not present

## 2023-05-31 DIAGNOSIS — E119 Type 2 diabetes mellitus without complications: Secondary | ICD-10-CM

## 2023-05-31 DIAGNOSIS — I252 Old myocardial infarction: Secondary | ICD-10-CM | POA: Insufficient documentation

## 2023-05-31 DIAGNOSIS — Z0000 Encounter for general adult medical examination without abnormal findings: Secondary | ICD-10-CM | POA: Diagnosis not present

## 2023-05-31 LAB — LIPID PANEL
Cholesterol: 102 mg/dL (ref 0–200)
HDL: 54.7 mg/dL (ref >39.00)
LDL Cholesterol: 29 mg/dL (ref 0–99)
NonHDL: 47.65
Total CHOL/HDL Ratio: 2
Triglycerides: 95 mg/dL (ref 0.0–149.0)
VLDL: 19 mg/dL (ref 0.0–40.0)

## 2023-05-31 LAB — HEMOGLOBIN A1C: Hgb A1c MFr Bld: 6.2 % (ref 4.6–6.5)

## 2023-05-31 NOTE — Assessment & Plan Note (Signed)
Continue atorvastatin 40 mg once daily Ordered lipid panel, pending results. Work on low cholesterol diet and exercise as tolerated  

## 2023-05-31 NOTE — Assessment & Plan Note (Signed)
Continue otc supplementation.   

## 2023-05-31 NOTE — Assessment & Plan Note (Signed)
Controlled.  A1c today if controlled will do one year f/u appt.  Work on diabetic diet and exercise as tolerated. Yearly foot exam, and annual eye exam.  Pt declines referral for diabetic educator.

## 2023-05-31 NOTE — Progress Notes (Signed)
Established Patient Office Visit  Subjective:      CC:  Chief Complaint  Patient presents with   Diabetes   Annual Exam    HPI: Brenda Chavez is a 62 y.o. female presenting on 05/31/2023 for Diabetes and Annual Exam .as well as here for annual exam.   DM2, last visit d/c metformin and started on januvia 50 mg once daily. A1c three months ago at desired goal. Confused at times with what she should eat for her diabetes. She did see a nutritionist   Lab Results  Component Value Date   HGBA1C 5.9 02/28/2023   HLD: on atorvastatin 40 mg once daily, tolerating well.   HTN: doing well on metoprolol 1/2 tablet twice daily. Also on losartan 25 mg once daily.   Wt Readings from Last 3 Encounters:  05/31/23 132 lb 12.8 oz (60.2 kg)  11/28/22 136 lb (61.7 kg)  08/29/22 141 lb 4 oz (64.1 kg)   Recent visit with ophthalmologist, till taking timolol  Dexa: 12/05/22  03/22/22 mammogram, has order in place has to schedule.  Flu vaccination: already had this year. Last month had this done.  Colonoscopy: 05/12/2013 , has been called. Has to call back to schedule.   Utd on eye and dental exams.  Does not have an advanced directive or living will.  Exercise: walks at least five times a day.  General diet, tries to work on diabetic diet.   Last metabolic panel Lab Results  Component Value Date   GLUCOSE 84 02/28/2023   NA 138 02/28/2023   K 4.3 02/28/2023   CL 102 02/28/2023   CO2 23 02/28/2023   BUN 21 02/28/2023   CREATININE 0.93 02/28/2023   GFR 66.70 02/28/2023   CALCIUM 9.7 02/28/2023   PROT 7.4 02/28/2023   ALBUMIN 4.6 02/28/2023   BILITOT 0.6 02/28/2023   ALKPHOS 87 02/28/2023   AST 25 02/28/2023   ALT 29 02/28/2023   ANIONGAP 5 02/07/2021        Social history:  Relevant past medical, surgical, family and social history reviewed and updated as indicated. Interim medical history since our last visit reviewed.  Allergies and medications reviewed and  updated.  DATA REVIEWED: CHART IN EPIC     ROS: Negative unless specifically indicated above in HPI.    Current Outpatient Medications:    aspirin EC 81 MG tablet, Take 1 tablet (81 mg total) by mouth daily., Disp: 90 tablet, Rfl: 3   atorvastatin (LIPITOR) 40 MG tablet, TAKE 1 TABLET DAILY, Disp: 90 tablet, Rfl: 0   Blood Glucose Monitoring Suppl (FREESTYLE LITE) w/Device KIT, 1 Device by Does not apply route daily., Disp: 1 kit, Rfl: 0   cholecalciferol (VITAMIN D) 1000 UNITS tablet, Take 1,000 Units by mouth daily., Disp: , Rfl:    glucose blood (FREESTYLE TEST STRIPS) test strip, Use as instructed, Disp: 100 each, Rfl: 12   Lancets (FREESTYLE) lancets, Use as instructed, Disp: 100 each, Rfl: 12   latanoprost (XALATAN) 0.005 % ophthalmic solution, Place 1 drop into both eyes at bedtime., Disp: , Rfl:    losartan (COZAAR) 25 MG tablet, TAKE 1 TABLET DAILY, Disp: 90 tablet, Rfl: 3   metoprolol tartrate (LOPRESSOR) 25 MG tablet, TAKE ONE-HALF (1/2) TABLET TWICE A DAY, Disp: 90 tablet, Rfl: 0   nitroGLYCERIN (NITROSTAT) 0.4 MG SL tablet, Place 1 tablet (0.4 mg total) under the tongue every 5 (five) minutes x 3 doses as needed for chest pain., Disp: 25 tablet, Rfl: 6  sitaGLIPtin (JANUVIA) 50 MG tablet, Take 1 tablet (50 mg total) by mouth daily., Disp: 90 tablet, Rfl: 3   timolol (TIMOPTIC) 0.5 % ophthalmic solution, Place 1 drop into both eyes 2 (two) times daily., Disp: , Rfl:       Objective:    BP 124/80 (BP Location: Left Arm, Patient Position: Sitting, Cuff Size: Normal)   Pulse 78   Temp 97.8 F (36.6 C) (Temporal)   Ht 5\' 4"  (1.626 m)   Wt 132 lb 12.8 oz (60.2 kg)   SpO2 99%   BMI 22.80 kg/m   Wt Readings from Last 3 Encounters:  05/31/23 132 lb 12.8 oz (60.2 kg)  11/28/22 136 lb (61.7 kg)  08/29/22 141 lb 4 oz (64.1 kg)    Physical Exam Constitutional:      General: She is not in acute distress.    Appearance: Normal appearance. She is normal weight. She is not  ill-appearing, toxic-appearing or diaphoretic.  HENT:     Head: Normocephalic.  Cardiovascular:     Rate and Rhythm: Normal rate and regular rhythm.  Pulmonary:     Effort: Pulmonary effort is normal.     Breath sounds: Normal breath sounds.  Musculoskeletal:        General: Normal range of motion.     Right lower leg: No edema.     Left lower leg: No edema.  Neurological:     General: No focal deficit present.     Mental Status: She is alert and oriented to person, place, and time. Mental status is at baseline.  Psychiatric:        Mood and Affect: Mood normal.        Behavior: Behavior normal.        Thought Content: Thought content normal.        Judgment: Judgment normal.           Assessment & Plan:  History of non-ST elevation myocardial infarction (NSTEMI) Assessment & Plan: Continue asa 81 mg once daily  Continue metoprolol 1/2 tablet 25 mg twice daily  Continue losartan 25 mg once daily  Cont f/u with cardiology as scheduled.    Hyperlipidemia LDL goal <70 Assessment & Plan: Continue atorvastatin 40 mg once daily.  Ordered lipid panel, pending results. Work on low cholesterol diet and exercise as tolerated   Orders: -     Lipid panel  Encounter for general adult medical examination without abnormal findings  Type 2 diabetes mellitus without complication, without long-term current use of insulin (HCC) Assessment & Plan: Controlled.  A1c today if controlled will do one year f/u appt.  Work on diabetic diet and exercise as tolerated. Yearly foot exam, and annual eye exam.  Pt declines referral for diabetic educator.    Orders: -     Hemoglobin A1c  Osteopenia, unspecified location Assessment & Plan: On drug holiday for fosamax, has been managed by GYN. Utd on dexa    Vitamin D deficiency Assessment & Plan: Continue otc supplementation.       Return in about 1 year (around 05/30/2024) for f/u CPE.  Mort Sawyers, MSN, APRN, FNP-C [place]  [facility]

## 2023-05-31 NOTE — Assessment & Plan Note (Signed)
On drug holiday for fosamax, has been managed by GYN. Utd on dexa

## 2023-05-31 NOTE — Assessment & Plan Note (Addendum)
Continue asa 81 mg once daily  Continue metoprolol 1/2 tablet 25 mg twice daily  Continue losartan 25 mg once daily  Cont f/u with cardiology as scheduled.

## 2023-06-24 NOTE — Progress Notes (Signed)
'  No chief complaint on file.  History of Present Illness: 62 yo female with history of CAD and HLD here today for cardiac follow up. She was admitted to [facility] August 2016 with a NSTEMI and found to have a severe stenosis in the Diagonal branch treated with a drug eluting stent. Echo March 2021 with normal LV function, no valve disease. She had a possible occular stroke in 2022. Carotid artery dopplers May 2022 were normal.    She is here today for follow up. The patient denies any chest pain, dyspnea, palpitations, lower extremity edema, orthopnea, PND, dizziness, near syncope or syncope.   Primary Care Physician: Mort Sawyers, FNP  Past Medical History:  Diagnosis Date   Abnormal Pap smear of cervix    Anxiety    CAD (coronary artery disease) cardiologist--- dr Clifton James   a. 04/2015 NSTEMI/PCI: LM nl, LAD 30d, D1 100 (2.25x12 Promus Premier DES), D2 small, RI small, LCX nl, OM1/2 nl, RCA/PDA nl, EF 45-50%.   Diabetes mellitus type 2, diet-controlled (HCC)    followed by pcp--- newly dx 03/ 2022   Family history of adverse reaction to anesthesia    mother-- ponv   GERD (gastroesophageal reflux disease)    Glaucoma, both eyes    History of non-ST elevation myocardial infarction (NSTEMI) 05/14/2015   s/p  PCI and DES   Hyperlipidemia    Osteoporosis    S/P drug eluting coronary stent placement 05/14/2015   DES x1 to D1   Wears glasses     Past Surgical History:  Procedure Laterality Date   CARDIAC CATHETERIZATION N/A 05/14/2015   Procedure: Left Heart Cath and Coronary Angiography;  Surgeon: Kathleene Hazel, MD;  Location: [facility] INVASIVE CV LAB;  Service: Cardiovascular;  Laterality: N/A;   CARDIAC CATHETERIZATION N/A 05/14/2015   Procedure: Coronary Stent Intervention;  Surgeon: Kathleene Hazel, MD;  Location: [facility] INVASIVE CV LAB;  Service: Cardiovascular;  Laterality: N/A;   CERVICAL CONIZATION W/BX N/A 02/09/2021   Procedure: COLD KNIFE CONIZATION CERVIX;  Surgeon:  Carver Fila, MD;  Location: [facility];  Service: Gynecology;  Laterality: N/A;   DILATION AND CURETTAGE OF UTERUS  2021   HYSTEROSCOPY WITH D & C N/A 02/09/2021   Procedure: DILATATION AND CURETTAGE /HYSTEROSCOPY WITH MYOSURE, POLYPECTOMY;  Surgeon: Carver Fila, MD;  Location: [facility];  Service: Gynecology;  Laterality: N/A;   WISDOM TOOTH EXTRACTION  1984   Current Medications:   Current Outpatient Medications:  No outpatient medications have been marked as taking for the 06/25/23 encounter (Appointment) with Kathleene Hazel, MD.    Allergies  Allergen Reactions   Erythromycin Nausea And Vomiting    Ended up in hospital needing IVF     Macrobid [Nitrofurantoin Macrocrystal] Nausea And Vomiting    Ended up in hospital needing IVF     Floxin [Ofloxacin] Other (See Comments)    Unable to close eyes   Metformin And Related Other (See Comments)    Abd bloat and diarrhea    Social History   Socioeconomic History   Marital status: Married    Spouse name: Not on file   Number of children: 0   Years of education: Not on file   Highest education level: Not on file  Occupational History   Occupation: retired  Tobacco Use   Smoking status: Never   Smokeless tobacco: Never  Vaping Use   Vaping status: Never Used  Substance and Sexual Activity   Alcohol  use: No   Drug use: Never   Sexual activity: Yes    Partners: Male    Birth control/protection: Post-menopausal  Other Topics Concern   Not on file  Social History Narrative   Not on file   Social Determinants of Health   Financial Resource Strain: Not on file  Food Insecurity: Not on file  Transportation Needs: Not on file  Physical Activity: Not on file  Stress: Not on file  Social Connections: Not on file  Intimate Partner Violence: Not on file    Family History  Problem Relation Age of Onset   Breast cancer Cousin 50   Heart disease Mother    Heart  disease Father    COPD Father    Heart attack Maternal Grandmother    Heart attack Paternal Grandmother    Breast cancer Maternal Aunt 50   Diabetes Maternal Aunt    Heart disease Paternal Aunt    Colon cancer Cousin    Breast cancer Cousin    Cancer Other    Hypertension Other    Ovarian cancer Neg Hx    Pancreatic cancer Neg Hx    Endometrial cancer Neg Hx     Review of Systems:  As stated in the HPI and otherwise negative.   There were no vitals taken for this visit.  Physical Examination:  General: Well developed, well nourished, NAD  HEENT: OP clear, mucus membranes moist  SKIN: warm, dry. No rashes. Neuro: No focal deficits  Musculoskeletal: Muscle strength 5/5 all ext  Psychiatric: Mood and affect normal  Neck: No JVD, no carotid bruits, no thyromegaly, no lymphadenopathy.  Lungs:Clear bilaterally, no wheezes, rhonci, crackles Cardiovascular: Regular rate and rhythm. No murmurs, gallops or rubs. Abdomen:Soft. Bowel sounds present. Non-tender.  Extremities: No lower extremity edema. Pulses are 2 + in the bilateral DP/PT.  Echo March 2021: . 1. Left ventricular ejection fraction, by estimation, is 60 to 65%. The  left ventricle has normal function. The left ventricle has no regional  wall motion abnormalities. Left ventricular diastolic parameters are  consistent with Grade II diastolic  dysfunction (pseudonormalization). The average left ventricular global  longitudinal strain is -21.0 %.   2. Right ventricular systolic function is normal. The right ventricular  size is normal. There is normal pulmonary artery systolic pressure.   3. The mitral valve is normal in structure. No evidence of mitral valve  regurgitation. No evidence of mitral stenosis.   4. The aortic valve is normal in structure. Aortic valve regurgitation is  not visualized. No aortic stenosis is present.   5. The inferior vena cava is normal in size with greater than 50%  respiratory variability,  suggesting right atrial pressure of 3 mmHg.   EKG:  EKG is *** ordered today. The ekg ordered today demonstrates   Recent Labs: 02/28/2023: ALT 29; BUN 21; Creatinine, Ser 0.93; Potassium 4.3; Sodium 138   Lipid Panel    Component Value Date/Time   CHOL 102 05/31/2023 1220   CHOL 127 01/28/2020 0947   TRIG 95.0 05/31/2023 1220   HDL 54.70 05/31/2023 1220   HDL 58 01/28/2020 0947   CHOLHDL 2 05/31/2023 1220   VLDL 19.0 05/31/2023 1220   LDLCALC 29 05/31/2023 1220   LDLCALC 55 09/01/2021 0848     Wt Readings from Last 3 Encounters:  05/31/23 60.2 kg  11/28/22 61.7 kg  08/29/22 64.1 kg     Assessment and Plan:   1.  CAD without angina: She had a NSTEMI  in August 2016 and had a DES placed in the Diagonal branch. No chest pain. Continue ASA, statin and beta blocker.      2. Hyperlipidemia: LDL at goal in September 2024. Continue statin  Labs/ tests ordered today include:  No orders of the defined types were placed in this encounter.  Disposition:   F/U with me in 12 months  Signed, Verne Carrow, MD 06/24/2023 6:26 PM    [facility] [address] [place], [place], [place]  [address] Phone: [phone]; Fax: [phone]

## 2023-06-25 ENCOUNTER — Encounter: Payer: Self-pay | Admitting: Cardiovascular Disease

## 2023-06-25 ENCOUNTER — Ambulatory Visit: Payer: 59 | Attending: Cardiovascular Disease | Admitting: Cardiovascular Disease

## 2023-06-25 VITALS — BP 118/70 | HR 57 | Ht 64.0 in | Wt 132.0 lb

## 2023-06-25 DIAGNOSIS — I2510 Atherosclerotic heart disease of native coronary artery without angina pectoris: Secondary | ICD-10-CM

## 2023-06-25 DIAGNOSIS — E7800 Pure hypercholesterolemia, unspecified: Secondary | ICD-10-CM

## 2023-06-25 NOTE — Patient Instructions (Signed)

## 2023-08-18 ENCOUNTER — Other Ambulatory Visit: Payer: Self-pay | Admitting: Cardiovascular Disease

## 2023-08-28 ENCOUNTER — Other Ambulatory Visit: Payer: Self-pay | Admitting: Cardiovascular Disease

## 2023-11-29 ENCOUNTER — Other Ambulatory Visit: Payer: Self-pay | Admitting: Family

## 2023-11-29 ENCOUNTER — Other Ambulatory Visit: Payer: Self-pay | Admitting: Obstetrics and Gynecology

## 2023-11-29 DIAGNOSIS — R809 Proteinuria, unspecified: Secondary | ICD-10-CM

## 2023-11-29 DIAGNOSIS — Z1231 Encounter for screening mammogram for malignant neoplasm of breast: Secondary | ICD-10-CM

## 2024-02-09 ENCOUNTER — Other Ambulatory Visit: Payer: Self-pay | Admitting: Family

## 2024-02-09 DIAGNOSIS — E119 Type 2 diabetes mellitus without complications: Secondary | ICD-10-CM

## 2024-04-11 ENCOUNTER — Ambulatory Visit
Admission: RE | Admit: 2024-04-11 | Discharge: 2024-04-11 | Disposition: A | Discharge status: Home / Self Care | Source: Ambulatory Visit | Attending: Obstetrics and Gynecology | Admitting: Obstetrics and Gynecology

## 2024-04-11 DIAGNOSIS — Z1231 Encounter for screening mammogram for malignant neoplasm of breast: Secondary | ICD-10-CM

## 2024-04-16 ENCOUNTER — Other Ambulatory Visit: Payer: Self-pay | Admitting: Obstetrics and Gynecology

## 2024-04-16 DIAGNOSIS — R928 Other abnormal and inconclusive findings on diagnostic imaging of breast: Secondary | ICD-10-CM

## 2024-04-18 ENCOUNTER — Ambulatory Visit
Admission: RE | Admit: 2024-04-18 | Discharge: 2024-04-18 | Disposition: A | Discharge status: Home / Self Care | Source: Ambulatory Visit | Attending: Obstetrics and Gynecology | Admitting: Obstetrics and Gynecology

## 2024-04-18 ENCOUNTER — Other Ambulatory Visit

## 2024-04-18 DIAGNOSIS — R928 Other abnormal and inconclusive findings on diagnostic imaging of breast: Secondary | ICD-10-CM

## 2024-05-11 ENCOUNTER — Other Ambulatory Visit: Payer: Self-pay | Admitting: Family

## 2024-05-11 ENCOUNTER — Other Ambulatory Visit: Payer: Self-pay | Admitting: Cardiovascular Disease

## 2024-05-11 DIAGNOSIS — R809 Proteinuria, unspecified: Secondary | ICD-10-CM

## 2024-06-05 ENCOUNTER — Encounter: Payer: 59 | Admitting: Family

## 2024-06-11 LAB — HM DIABETES EYE EXAM

## 2024-07-02 ENCOUNTER — Ambulatory Visit: Payer: Self-pay | Admitting: Family

## 2024-07-02 ENCOUNTER — Encounter: Payer: Self-pay | Admitting: Pharmacist

## 2024-07-02 ENCOUNTER — Telehealth: Payer: Self-pay | Admitting: Family

## 2024-07-02 ENCOUNTER — Ambulatory Visit: Admitting: Family

## 2024-07-02 ENCOUNTER — Encounter: Payer: Self-pay | Admitting: Family

## 2024-07-02 VITALS — BP 114/62 | HR 82 | Temp 98.0°F | Ht 64.0 in | Wt 128.2 lb

## 2024-07-02 DIAGNOSIS — E785 Hyperlipidemia, unspecified: Secondary | ICD-10-CM

## 2024-07-02 DIAGNOSIS — Z1211 Encounter for screening for malignant neoplasm of colon: Secondary | ICD-10-CM | POA: Diagnosis not present

## 2024-07-02 DIAGNOSIS — E559 Vitamin D deficiency, unspecified: Secondary | ICD-10-CM

## 2024-07-02 DIAGNOSIS — F5101 Primary insomnia: Secondary | ICD-10-CM

## 2024-07-02 DIAGNOSIS — Z0001 Encounter for general adult medical examination with abnormal findings: Secondary | ICD-10-CM | POA: Diagnosis not present

## 2024-07-02 DIAGNOSIS — Z7984 Long term (current) use of oral hypoglycemic drugs: Secondary | ICD-10-CM

## 2024-07-02 DIAGNOSIS — Z23 Encounter for immunization: Secondary | ICD-10-CM | POA: Diagnosis not present

## 2024-07-02 DIAGNOSIS — H409 Unspecified glaucoma: Secondary | ICD-10-CM

## 2024-07-02 DIAGNOSIS — E119 Type 2 diabetes mellitus without complications: Secondary | ICD-10-CM | POA: Diagnosis not present

## 2024-07-02 DIAGNOSIS — I252 Old myocardial infarction: Secondary | ICD-10-CM

## 2024-07-02 DIAGNOSIS — R7303 Prediabetes: Secondary | ICD-10-CM | POA: Insufficient documentation

## 2024-07-02 LAB — CBC
HCT: 44 % (ref 36.0–46.0)
Hemoglobin: 14 g/dL (ref 12.0–15.0)
MCHC: 31.8 g/dL (ref 30.0–36.0)
MCV: 93.6 fl (ref 78.0–100.0)
Platelets: 236 K/uL (ref 150.0–400.0)
RBC: 4.7 Mil/uL (ref 3.87–5.11)
RDW: 14.5 % (ref 11.5–15.5)
WBC: 7.6 K/uL (ref 4.0–10.5)

## 2024-07-02 LAB — BASIC METABOLIC PANEL WITH GFR
BUN: 23 mg/dL (ref 6–23)
CO2: 30 meq/L (ref 19–32)
Calcium: 9.4 mg/dL (ref 8.4–10.5)
Chloride: 107 meq/L (ref 96–112)
Creatinine, Ser: 0.84 mg/dL (ref 0.40–1.20)
GFR: 74.66 mL/min (ref >60.00)
Glucose, Bld: 99 mg/dL (ref 70–99)
Potassium: 5.1 meq/L (ref 3.5–5.1)
Sodium: 144 meq/L (ref 135–145)

## 2024-07-02 LAB — LIPID PANEL
Cholesterol: 106 mg/dL (ref 0–200)
HDL: 54.3 mg/dL (ref >39.00)
LDL Cholesterol: 38 mg/dL (ref 0–99)
NonHDL: 52.15
Total CHOL/HDL Ratio: 2
Triglycerides: 73 mg/dL (ref 0.0–149.0)
VLDL: 14.6 mg/dL (ref 0.0–40.0)

## 2024-07-02 LAB — TSH: TSH: 1.42 u[IU]/mL (ref 0.35–5.50)

## 2024-07-02 LAB — MICROALBUMIN / CREATININE URINE RATIO
Creatinine,U: 167.2 mg/dL
Microalb Creat Ratio: 8.8 mg/g (ref 0.0–30.0)
Microalb, Ur: 1.5 mg/dL (ref 0.0–1.9)

## 2024-07-02 NOTE — Telephone Encounter (Signed)
 Which medication would be safest profile for use for insomnia when pt has h/o STEMI (2016) and glaucoma, she is unsure of type.

## 2024-07-02 NOTE — Progress Notes (Signed)
 Subjective:  Patient ID: Brenda Chavez, female    DOB: 01-25-1961  Age: 63 y.o. MRN: [id number]  Patient Care Team: Corwin Antu, FNP as PCP - General (Family Medicine) Verlin Lonni BIRCH, MD as PCP - Cardiology (Cardiology) Verlin Lonni BIRCH, MD as Consulting Physician (Cardiology) Camillo Golas, MD as Consulting Physician (Ophthalmology) [place], [facility]   CC:  Chief Complaint  Patient presents with   Annual Exam    HPI Brenda Chavez is a 63 y.o. female who presents today for an annual physical exam. She reports consuming a general diet. Isometric, treadmill and often playing with her dog several times a day She generally feels well. She reports sleeping poorly. She does have additional problems to discuss today.   Vision:Within last year Dental:Receives regular dental care   Mammogram: 04/18/24 Last pap: 11/22/23 negative [facility] Dr[facility] German Colonoscopy: 2014 due, order in   Pt is with acute concerns.   Discussed the use of AI scribe software for clinical note transcription with the patient, who gave verbal consent to proceed.  History of Present Illness Brenda Chavez is a 63 year old female who presents with sleep disturbances.  She has experienced sleep disturbances for several years, averaging four hours of sleep per night. Over-the-counter melatonin has been ineffective, and she has not used any prescription sleep aids. She is interested in trying a prescription medication as long as it is not habit-forming. No excessive daytime tiredness or morning headaches. Occasionally wakes herself up with mild snoring but does not experience significant snoring as reported by others.  She has a history of a heart attack in August 2016 and follows up with a cardiologist. Currently on aspirin  and Januvia  50 mg for diabetes management. She reports that she has never had high cholesterol. Her A1c was last recorded at 6.2% on May 31, 2023, which is  in the prediabetic range. She is not on insulin and has not experienced hypoglycemic episodes.  She has glaucoma in both eyes and uses eye drops for management. She is unsure if it is narrow-angle glaucoma. Denies any chest pain during physical activity and reports no gastrointestinal issues such as constipation or diarrhea. She reports that she is able to see the bottom of her feet.  She mentions having had a significant reaction to the second shingles vaccine, including symptoms of vomiting, fever, and body aches, which she likened to having COVID-19 again.   Advanced Directives Patient does not have advanced directives     DEPRESSION SCREENING    07/02/2024    8:52 AM 11/28/2022   12:00 PM 09/01/2021    8:34 AM 02/25/2021    2:23 PM 02/17/2021    2:07 PM 12/07/2020   10:18 AM 05/06/2019    2:38 PM  PHQ 2/9 Scores  PHQ - 2 Score 0 0 0 0 0 0 0  PHQ- 9 Score 2 0 0 0        ROS: Negative unless specifically indicated above in HPI.    Current Outpatient Medications:    aspirin  EC 81 MG tablet, Take 1 tablet (81 mg total) by mouth daily., Disp: 90 tablet, Rfl: 3   atorvastatin  (LIPITOR ) 40 MG tablet, TAKE 1 TABLET DAILY (PLEASE KEEP YOUR UPCOMING APPOINTMENT FOR FUTURE REFILLS), Disp: 90 tablet, Rfl: 0   Blood Glucose Monitoring Suppl (FREESTYLE LITE) w/Device KIT, 1 Device by Does not apply route daily., Disp: 1 kit, Rfl: 0   cholecalciferol (VITAMIN D ) 1000 UNITS tablet,  Take 1,000 Units by mouth daily., Disp: , Rfl:    glucose blood (FREESTYLE TEST STRIPS) test strip, Use as instructed, Disp: 100 each, Rfl: 12   JANUVIA  50 MG tablet, TAKE 1 TABLET DAILY, Disp: 90 tablet, Rfl: 3   Lancets (FREESTYLE) lancets, Use as instructed, Disp: 100 each, Rfl: 12   latanoprost (XALATAN) 0.005 % ophthalmic solution, Place 1 drop into both eyes at bedtime., Disp: , Rfl:    losartan  (COZAAR ) 25 MG tablet, TAKE 1 TABLET DAILY, Disp: 90 tablet, Rfl: 0   metoprolol  tartrate (LOPRESSOR ) 25 MG tablet, TAKE  ONE-HALF (1/2) TABLET TWICE A DAY, Disp: 90 tablet, Rfl: 0   nitroGLYCERIN  (NITROSTAT ) 0.4 MG SL tablet, Place 1 tablet (0.4 mg total) under the tongue every 5 (five) minutes x 3 doses as needed for chest pain., Disp: 25 tablet, Rfl: 6   timolol (TIMOPTIC) 0.5 % ophthalmic solution, Place 1 drop into both eyes 2 (two) times daily., Disp: , Rfl:     Objective:    BP 114/62 (BP Location: Left Arm, Patient Position: Sitting, Cuff Size: Normal)   Pulse 82   Temp 98 F (36.7 C)   Ht 5' 4 (1.626 m)   Wt 128 lb 3.2 oz (58.2 kg)   SpO2 100%   BMI 22.01 kg/m   BP Readings from Last 3 Encounters:  07/02/24 114/62  06/25/23 118/70  05/31/23 124/80      Physical Exam Constitutional:      General: She is not in acute distress.    Appearance: Normal appearance. She is normal weight. She is not ill-appearing.  HENT:     Head: Normocephalic.     Right Ear: Tympanic membrane normal.     Left Ear: Tympanic membrane normal.     Nose: Nose normal.     Mouth/Throat:     Mouth: Mucous membranes are moist.  Eyes:     Extraocular Movements: Extraocular movements intact.     Pupils: Pupils are equal, round, and reactive to light.  Cardiovascular:     Rate and Rhythm: Normal rate and regular rhythm.  Pulmonary:     Effort: Pulmonary effort is normal.     Breath sounds: Normal breath sounds.  Abdominal:     General: Abdomen is flat. Bowel sounds are normal.     Palpations: Abdomen is soft.     Tenderness: There is no guarding or rebound.  Musculoskeletal:        General: Normal range of motion.     Cervical back: Normal range of motion.  Skin:    General: Skin is warm.     Capillary Refill: Capillary refill takes less than 2 seconds.  Neurological:     General: No focal deficit present.     Mental Status: She is alert.  Psychiatric:        Mood and Affect: Mood normal.        Behavior: Behavior normal.        Thought Content: Thought content normal.        Judgment: Judgment normal.      Lab Results  Component Value Date   HGBA1C 6.2 05/31/2023     Results LABS   A1c: 6.2% (05/31/2023)  Title   Diabetic Foot Exam - detailed Is there a history of foot ulcer?: No Is there a foot ulcer now?: No Is there swelling?: No Is there elevated skin temperature?: No Is there abnormal foot shape?: No Is there a claw toe deformity?: No Are the  toenails long?: No Are the toenails thick?: No Are the toenails ingrown?: No Is the skin thin, fragile, shiny and hairless?: No Normal Range of Motion?: Yes Is there foot or ankle muscle weakness?: No Do you have pain in calf while walking?: No Are the shoes appropriate in style and fit?: Yes Can the patient see the bottom of their feet?: Yes Pulse Foot Exam completed.: Yes   Right Posterior Tibialis: Present Left posterior Tibialis: Present   Right Dorsalis Pedis: Present Left Dorsalis Pedis: Present     Semmes-Weinstein Monofilament Test + means has sensation and - means no sensation  R Foot Test Control: Pos L Foot Test Control: Pos   R Site 1-Great Toe: Pos L Site 1-Great Toe: Pos   R Site 4: Pos L Site 4: Pos   R site 5: Pos L Site 5: Pos  R Site 6: Pos L Site 6: Pos     Image components are not supported.   Image components are not supported. Image components are not supported.  [place] Comments        Assessment & Plan:   Assessment and Plan Assessment & Plan Insomnia Chronic insomnia with difficulty maintaining sleep, averaging four hours per night. Previous melatonin trials were ineffective. No prior use of prescription sleep aids. Concerns about habit-forming medications and excessive sedation. Myocardial infarction and glaucoma may limit medication options. - Consult pharmacist for safest sleep medication considering myocardial infarction and glaucoma - Consider trazodone if deemed safe - Follow up after pharmacist consultation to discuss sleep medication options  Type 2 diabetes  mellitus Type 2 diabetes mellitus with A1c at 6.2%. No insulin use. On Januvia  50 mg. Interest in CGM but insurance coverage unlikely due to A1c level and lack of insulin use. Discussion of natural supplements for blood sugar control, but potential interactions with aspirin  limit options. - Check A1c, cholesterol, and thyroid - Discussed that current A1c level does not necessitate CGM and may lead to unnecessary anxiety - Advise against natural supplements like cinnamon and garlic due to potential interactions with aspirin  - Continue Januvia  50 mg  Glaucoma, both eyes Glaucoma in both eyes, type unspecified. Current treatment includes eye drops. Glaucoma may limit options for sleep medications due to potential interactions. - Consult pharmacist to determine safe sleep medication options considering glaucoma  History of myocardial infarction (2016) Myocardial infarction in August 2016. Currently on aspirin  therapy. Regular follow-up with cardiologist is maintained. - Ensure any new medications, particularly for insomnia, are safe given cardiac history  General Health Maintenance Discussion of vaccinations including RSV and pneumonia. Received pneumococcal 23 in 2022 and flu shot recently. Interest in RSV vaccine, recommended for those 50 and older. New Prevnar 20 vaccine available due to mutations since last pneumococcal vaccination. - Administer Prevnar 20 vaccine today - Discuss RSV vaccine as a personal choice, recommended for those 50 and older   Patient Counseling(The following topics were reviewed):  Preventative care handout given to pt  Health maintenance and immunizations reviewed. Please refer to Health maintenance section. Pt advised on safe sex, wearing seatbelts in car, and proper nutrition labwork ordered today for annual Dental health: Discussed importance of regular tooth brushing, flossing, and dental visits.      Follow-up: Return in about 1 year (around 07/02/2025)  for f/u CPE.   Ginger Patrick, FNP

## 2024-07-02 NOTE — Progress Notes (Signed)
 Chart Review Reason: Drug information Question - insomnia treatment w concurrent glaucoma   Summary/Considerations: Avoid (generally) Antihistamines/antimuscarinics should be avoided in glaucoma due to potential to affect intraocular pressure (e.g. avoid diphenhydramine and doxylamine). Trazodone: General warning for possible pupillary dilation/risk of narrow-angle glaucoma in susceptible individuals Mirtazapine/Remeron = anticholinergic, avoid  Considerations:  Melatonin generally regarded as safe for use in glaucoma Hyponotics and Ramelteon are generally more controversial due to very limited/conflicting evidence of potential anticholinergic properties at higher doses. Often these medications are used with general caution/monitoring given no direct connection with worsening glaucoma. It is reasonable to request patient ask their eye doctor if this medication is okay before starting.  DORA agents (daridorexant, lemborexant, suvorexants), while newer, have no reported risks of glaucoma and are generally considered safe. Are not considered anticholinergic.   The above considerations are generally also considered safe in cardiac history. No concerns for cardiac side effects, effects on blood pressure, or on CAD risks.    Manuelita FABIENE Kobs, PharmD Clinical Pharmacist [facility] [phone]

## 2024-07-03 ENCOUNTER — Encounter: Payer: Self-pay | Admitting: Cardiovascular Disease

## 2024-07-03 ENCOUNTER — Telehealth: Payer: Self-pay | Admitting: Family

## 2024-07-03 ENCOUNTER — Ambulatory Visit: Attending: Cardiovascular Disease | Admitting: Cardiovascular Disease

## 2024-07-03 VITALS — BP 103/63 | HR 72 | Ht 64.0 in | Wt 131.0 lb

## 2024-07-03 DIAGNOSIS — I2510 Atherosclerotic heart disease of native coronary artery without angina pectoris: Secondary | ICD-10-CM | POA: Diagnosis not present

## 2024-07-03 DIAGNOSIS — E119 Type 2 diabetes mellitus without complications: Secondary | ICD-10-CM

## 2024-07-03 DIAGNOSIS — E7800 Pure hypercholesterolemia, unspecified: Secondary | ICD-10-CM | POA: Diagnosis not present

## 2024-07-03 NOTE — Telephone Encounter (Signed)
 Copied from CRM [id number]. Topic: Clinical - Request for Lab/Test Order >> Jul 03, 2024 11:28 AM Rea ORN wrote: Reason for CRM: Pt called request lab for A1C. Pt stated it was to be drawn with her other labs yesterday. Please advise if pt needs to come back to clinic for the draw or if the sample she gave yesterday can be used to get A1C. Pt stated she goes out of town tomorrow after lunch. Please call back to advise.

## 2024-07-03 NOTE — Progress Notes (Signed)
 '  Chief Complaint  Patient presents with   Follow-up    CAD   History of Present Illness: 63 yo female with history of CAD, DM and HLD here today for cardiac follow up. [facility] with [facility] and found to have a severe stenosis in the Diagonal branch treated with a drug eluting stent. Echo March 2021 with normal LV function, no valve disease. She had a possible occular stroke in 2022. Carotid artery dopplers May 2022 were normal.    She is here today for follow up. The patient denies any chest pain, dyspnea, palpitations, lower extremity edema, orthopnea, PND, dizziness, near syncope or syncope.   Primary Care Physician: Corwin Antu, FNP  Past Medical History:  Diagnosis Date   Abnormal Pap smear of cervix    Anxiety    CAD (coronary artery disease) cardiologist--- dr verlin   a. 04/2015 NSTEMI/PCI: LM nl, LAD 30d, D1 100 (2.25x12 Promus Premier DES), D2 small, RI small, LCX nl, OM1/2 nl, RCA/PDA nl, EF 45-50%.   Diabetes mellitus type 2, diet-controlled (HCC)    followed by pcp--- newly dx 03/ 2022   Family history of adverse reaction to anesthesia    mother-- ponv   GERD (gastroesophageal reflux disease)    Glaucoma, both eyes    History of non-ST elevation myocardial infarction (NSTEMI) 05/14/2015   s/p  PCI and DES   Hyperlipidemia    Osteoporosis    S/P drug eluting coronary stent placement 05/14/2015   DES x1 to D1   Wears glasses     Past Surgical History:  Procedure Laterality Date   CARDIAC CATHETERIZATION N/A 05/14/2015   Procedure: Left Heart Cath and Coronary Angiography;  Surgeon: Lonni JONETTA verlin, MD;  Location: [facility] INVASIVE CV LAB;  Service: Cardiovascular;  Laterality: N/A;   CARDIAC CATHETERIZATION N/A 05/14/2015   Procedure: Coronary Stent Intervention;  Surgeon: Lonni JONETTA verlin, MD;  Location: [facility] INVASIVE CV LAB;  Service: Cardiovascular;  Laterality: N/A;   CERVICAL CONIZATION W/BX N/A 02/09/2021   Procedure: COLD  KNIFE CONIZATION CERVIX;  Surgeon: Viktoria Comer SAUNDERS, MD;  Location: [facility];  Service: Gynecology;  Laterality: N/A;   DILATION AND CURETTAGE OF UTERUS  2021   HYSTEROSCOPY WITH D & C N/A 02/09/2021   Procedure: DILATATION AND CURETTAGE /HYSTEROSCOPY WITH MYOSURE, POLYPECTOMY;  Surgeon: Viktoria Comer SAUNDERS, MD;  Location: [facility];  Service: Gynecology;  Laterality: N/A;   WISDOM TOOTH EXTRACTION  1984   Current Medications:   Current Outpatient Medications:  Current Meds  Medication Sig   aspirin  EC 81 MG tablet Take 1 tablet (81 mg total) by mouth daily.   atorvastatin  (LIPITOR ) 40 MG tablet TAKE 1 TABLET DAILY (PLEASE KEEP YOUR UPCOMING APPOINTMENT FOR FUTURE REFILLS)   Blood Glucose Monitoring Suppl (FREESTYLE LITE) w/Device KIT 1 Device by Does not apply route daily.   cholecalciferol (VITAMIN D ) 1000 UNITS tablet Take 1,000 Units by mouth daily.   glucose blood (FREESTYLE TEST STRIPS) test strip Use as instructed   JANUVIA  50 MG tablet TAKE 1 TABLET DAILY   Lancets (FREESTYLE) lancets Use as instructed   latanoprost (XALATAN) 0.005 % ophthalmic solution Place 1 drop into both eyes at bedtime.   losartan  (COZAAR ) 25 MG tablet TAKE 1 TABLET DAILY   metoprolol  tartrate (LOPRESSOR ) 25 MG tablet TAKE ONE-HALF (1/2) TABLET TWICE A DAY   nitroGLYCERIN  (NITROSTAT ) 0.4 MG SL tablet Place 1 tablet (0.4 mg total) under the tongue  every 5 (five) minutes x 3 doses as needed for chest pain.   timolol (TIMOPTIC) 0.5 % ophthalmic solution Place 1 drop into both eyes 2 (two) times daily.    Allergies  Allergen Reactions   Erythromycin Nausea And Vomiting    Ended up in hospital needing IVF     Macrobid [Nitrofurantoin Macrocrystal] Nausea And Vomiting    Ended up in hospital needing IVF     Floxin [Ofloxacin] Other (See Comments)    Unable to close eyes   Metformin  And Related Other (See Comments)    Abd bloat and diarrhea    Social History    Socioeconomic History   Marital status: Married    Spouse name: Not on file   Number of children: 0   Years of education: Not on file   Highest education level: Bachelor's degree (e.g., BA, AB, BS)  Occupational History   Occupation: retired  Tobacco Use   Smoking status: Never   Smokeless tobacco: Never  Vaping Use   Vaping status: Never Used  Substance and Sexual Activity   Alcohol use: No   Drug use: Never   Sexual activity: Yes    Partners: Male    Birth control/protection: Post-menopausal  Other Topics Concern   Not on file  Social History Narrative   Not on file   Social Drivers of Health   Financial Resource Strain: Low Risk  (06/29/2024)   Overall Financial Resource Strain (CARDIA)    Difficulty of Paying Living Expenses: Not hard at all  Food Insecurity: No Food Insecurity (06/29/2024)   Hunger Vital Sign    Worried About Running Out of Food in the Last Year: Never true    Ran Out of Food in the Last Year: Never true  Transportation Needs: No Transportation Needs (06/29/2024)   PRAPARE - Administrator, Civil Service (Medical): No    Lack of Transportation (Non-Medical): No  Physical Activity: Sufficiently Active (06/29/2024)   Exercise Vital Sign    Days of Exercise per Week: 3 days    Minutes of Exercise per Session: 50 min  Stress: No Stress Concern Present (06/29/2024)   [employer] of Occupational Health - Occupational Stress Questionnaire    Feeling of Stress: Only a little  Social Connections: Moderately Isolated (06/29/2024)   Social Connection and Isolation Panel    Frequency of Communication with Friends and Family: Three times a week    Frequency of Social Gatherings with Friends and Family: Twice a week    Attends Religious Services: Never    Database administrator or Organizations: No    Attends Engineer, structural: Not on file    Marital Status: Married  Catering manager Violence: Not on file    Family History   Problem Relation Age of Onset   Heart disease Mother    Heart disease Father    COPD Father    Breast cancer Maternal Aunt 50   Diabetes Maternal Aunt    Heart disease Paternal Aunt    Heart attack Maternal Grandmother    Heart attack Paternal Grandmother    Breast cancer Cousin 50   Breast cancer Cousin    Colon cancer Cousin    Breast cancer Cousin    Cancer Other    Hypertension Other    Ovarian cancer Neg Hx    Pancreatic cancer Neg Hx    Endometrial cancer Neg Hx     Review of Systems:  As stated in the HPI  and otherwise negative.   BP 103/63 (BP Location: Left Arm, Patient Position: Sitting, Cuff Size: Normal)   Pulse 72   Ht 5' 4 (1.626 m)   Wt 131 lb (59.4 kg)   SpO2 99%   BMI 22.49 kg/m   Physical Examination: General: Well developed, well nourished, NAD  HEENT: OP clear, mucus membranes moist  SKIN: warm, dry. No rashes. Neuro: No focal deficits  Musculoskeletal: Muscle strength 5/5 all ext  Psychiatric: Mood and affect normal  Neck: No JVD, no carotid bruits, no thyromegaly, no lymphadenopathy.  Lungs:Clear bilaterally, no wheezes, rhonci, crackles Cardiovascular: Regular rate and rhythm. No murmurs, gallops or rubs. Abdomen:Soft. Bowel sounds present. Non-tender.  Extremities: No lower extremity edema. Pulses are 2 + in the bilateral DP/PT.  EKG:  EKG is ordered today. The ekg ordered today demonstrates  EKG Interpretation Date/Time:  Thursday July 03 2024 15:57:52 EDT Ventricular Rate:  54 PR Interval:  126 QRS Duration:  74 QT Interval:  442 QTC Calculation: 419 R Axis:   65  Text Interpretation: Sinus bradycardia Confirmed by Verlin Bruckner [phone]) on 07/03/2024 4:01:32 PM   Recent Labs: 07/02/2024: BUN 23; Creatinine, Ser 0.84; Hemoglobin 14.0; Platelets 236.0; Potassium 5.1; Sodium 144; TSH 1.42   Lipid Panel    Component Value Date/Time   CHOL 106 07/02/2024 0937   CHOL 127 01/28/2020 0947   TRIG 73.0 07/02/2024 0937   HDL  54.30 07/02/2024 0937   HDL 58 01/28/2020 0947   CHOLHDL 2 07/02/2024 0937   VLDL 14.6 07/02/2024 0937   LDLCALC 38 07/02/2024 0937   LDLCALC 55 09/01/2021 0848    Wt Readings from Last 3 Encounters:  07/03/24 131 lb (59.4 kg)  07/02/24 128 lb 3.2 oz (58.2 kg)  06/25/23 132 lb (59.9 kg)    Assessment and Plan:   1.  CAD without angina: She had a NSTEMI in August 2016 and had a DES placed in the Diagonal branch. She has done well. No chest pain. Continue ASA, beta blocker and statin.       2. Hyperlipidemia: LDL 38 in October 2025. Continue statin.   Labs/ tests ordered today include:   Orders Placed This Encounter  Procedures   EKG 12-Lead   Disposition:   F/U with me in 12 months  Signed, Bruckner Verlin, MD 07/03/2024 4:09 PM    [facility] [address] [place], [place], [place]  [address] Phone: [phone]; Fax: [phone]

## 2024-07-03 NOTE — Telephone Encounter (Signed)
 Can we see if the lab can add this on?

## 2024-07-03 NOTE — Patient Instructions (Signed)
 Medication Instructions:  Your physician recommends that you continue on your current medications as directed. Please refer to the Current Medication list given to you today. *If you need a refill on your cardiac medications before your next appointment, please call your pharmacy*  Lab Work: NONE ORDERED If you have labs (blood work) drawn today and your tests are completely normal, you will receive your results only by: MyChart Message (if you have MyChart) OR A paper copy in the mail If you have any lab test that is abnormal or we need to change your treatment, we will call you to review the results.  Testing/Procedures: NONE ORDERED  Follow-Up: At [facility], you and your health needs are our priority.  As part of our continuing mission to provide you with exceptional heart care, our providers are all part of one team.  This team includes your primary Cardiologist (physician) and Advanced Practice Providers or APPs (Physician Assistants and Nurse Practitioners) who all work together to provide you with the care you need, when you need it.  Your next appointment:   12 month(s)  Provider:   Lonni Cash, MD    We recommend signing up for the patient portal called MyChart.  Sign up information is provided on this After Visit Summary.  MyChart is used to connect with patients for Virtual Visits (Telemedicine).  Patients are able to view lab/test results, encounter notes, upcoming appointments, etc.  Non-urgent messages can be sent to your provider as well.   To learn more about what you can do with MyChart, go to [email or web address].   Other Instructions

## 2024-07-04 ENCOUNTER — Ambulatory Visit

## 2024-07-04 DIAGNOSIS — E119 Type 2 diabetes mellitus without complications: Secondary | ICD-10-CM | POA: Diagnosis not present

## 2024-07-04 LAB — HEMOGLOBIN A1C: Hgb A1c MFr Bld: 6.1 % (ref 4.6–6.5)

## 2024-07-04 NOTE — Addendum Note (Signed)
 Addended by: CORWIN ANTU on: 07/04/2024 11:28 AM   Modules accepted: Orders

## 2024-07-04 NOTE — Telephone Encounter (Signed)
 Can we please add on A1c to 10/8 lab orders

## 2024-07-07 ENCOUNTER — Ambulatory Visit: Payer: Self-pay | Admitting: Family

## 2024-07-08 MED ORDER — BELSOMRA 10 MG PO TABS: ORAL_TABLET | ORAL | 0 refills | Status: DC

## 2024-07-08 NOTE — Telephone Encounter (Signed)
 Thank you so much for your input.

## 2024-07-08 NOTE — Addendum Note (Signed)
 Addended by: CORWIN ANTU on: 07/08/2024 03:46 PM   Modules accepted: Orders

## 2024-08-11 ENCOUNTER — Other Ambulatory Visit: Payer: Self-pay | Admitting: Cardiovascular Disease

## 2024-08-14 ENCOUNTER — Encounter: Payer: Self-pay | Admitting: Cardiovascular Disease

## 2024-08-14 NOTE — Telephone Encounter (Signed)
 Error

## 2024-08-15 ENCOUNTER — Ambulatory Visit (INDEPENDENT_AMBULATORY_CARE_PROVIDER_SITE_OTHER): Admitting: Family

## 2024-08-15 ENCOUNTER — Encounter: Payer: Self-pay | Admitting: Family

## 2024-08-15 VITALS — BP 116/70 | HR 76 | Temp 98.2°F | Ht 64.0 in | Wt 129.2 lb

## 2024-08-15 DIAGNOSIS — J029 Acute pharyngitis, unspecified: Secondary | ICD-10-CM

## 2024-08-15 DIAGNOSIS — Z20822 Contact with and (suspected) exposure to covid-19: Secondary | ICD-10-CM | POA: Diagnosis not present

## 2024-08-15 LAB — POC COVID19 BINAXNOW: SARS Coronavirus 2 Ag: NEGATIVE

## 2024-08-15 LAB — POCT RAPID STREP A (OFFICE): Rapid Strep A Screen: NEGATIVE

## 2024-08-15 MED ORDER — AMOXICILLIN-POT CLAVULANATE 875-125 MG PO TABS: 1.0000 | ORAL_TABLET | Freq: Two times a day (BID) | ORAL | 0 refills | Status: AC

## 2024-08-15 NOTE — Progress Notes (Signed)
 "  Established Patient Office Visit  Subjective:      CC:  Chief Complaint  Patient presents with   Acute Visit    Reports sore throat, cough and congestion x5 days.     HPI: Brenda Chavez is a 63 y.o. female presenting on 08/15/2024 for Acute Visit (Reports sore throat, cough and congestion x5 days. ) .  Discussed the use of AI scribe software for clinical note transcription with the patient, who gave verbal consent to proceed.  History of Present Illness Brenda Chavez is a 63 year old female with glaucoma who presents with sinus congestion and sore throat.  She has been experiencing significant sinus congestion and a sore throat for five days. Her throat is described as 'incredibly red' with 'tons of drainage', and she notes facial pain due to sinus pressure. There is a sensation of pressure in her ears without pain and a persistent cough without wheezing.  She has been taking 500 mg of Tylenol , two pills twice a day since Tuesday, and has gargled with warm salt water. She has not used over-the-counter allergy medications due to her current medication regimen and past ineffectiveness. She experiences '24/7 drainage down her throat' and is surprised she doesn't have a sore throat all the time.  Her sleep is severely impacted, with only about two hours of sleep this week due to worsening symptoms at night. She has a history of a heart attack, which restricts her from taking Aleve, and she now uses Tylenol  for headaches, which she rarely experiences. She has a known allergy to erythromycin and Macrobid, and she cannot take cefdinir.  In the review of symptoms, she reports sore throat, sinus pressure, ear pressure, cough without wheezing, and occasional slight headaches. Her eyes are red and irritated, but they do not itch. She has a history of glaucoma, which limits her use of certain medications like antihistamines. She has previously taken Sudafed without effect.         Social  history:  Relevant past medical, surgical, family and social history reviewed and updated as indicated. Interim medical history since our last visit reviewed.  Allergies and medications reviewed and updated.  DATA REVIEWED: CHART IN EPIC     ROS: Negative unless specifically indicated above in HPI.    Current Outpatient Medications:    amoxicillin -clavulanate (AUGMENTIN ) 875-125 MG tablet, Take 1 tablet by mouth 2 (two) times daily., Disp: 20 tablet, Rfl: 0   aspirin  EC 81 MG tablet, Take 1 tablet (81 mg total) by mouth daily., Disp: 90 tablet, Rfl: 3   atorvastatin  (LIPITOR ) 40 MG tablet, TAKE 1 TABLET DAILY (PLEASE KEEP YOUR UPCOMING APPOINTMENT FOR FUTURE REFILLS), Disp: 90 tablet, Rfl: 0   Blood Glucose Monitoring Suppl (FREESTYLE LITE) w/Device KIT, 1 Device by Does not apply route daily., Disp: 1 kit, Rfl: 0   cholecalciferol (VITAMIN D ) 1000 UNITS tablet, Take 1,000 Units by mouth daily., Disp: , Rfl:    glucose blood (FREESTYLE TEST STRIPS) test strip, Use as instructed, Disp: 100 each, Rfl: 12   JANUVIA  50 MG tablet, TAKE 1 TABLET DAILY, Disp: 90 tablet, Rfl: 3   Lancets (FREESTYLE) lancets, Use as instructed, Disp: 100 each, Rfl: 12   latanoprost (XALATAN) 0.005 % ophthalmic solution, Place 1 drop into both eyes at bedtime., Disp: , Rfl:    losartan  (COZAAR ) 25 MG tablet, TAKE 1 TABLET DAILY, Disp: 90 tablet, Rfl: 0   metoprolol  tartrate (LOPRESSOR ) 25 MG tablet, TAKE ONE-HALF (1/2) TABLET TWICE  A DAY (PLEASE KEEP UPCOMING OFFICE VISIT FOR FUTURE REFILL), Disp: 90 tablet, Rfl: 3   nitroGLYCERIN  (NITROSTAT ) 0.4 MG SL tablet, Place 1 tablet (0.4 mg total) under the tongue every 5 (five) minutes x 3 doses as needed for chest pain., Disp: 25 tablet, Rfl: 6   timolol (TIMOPTIC) 0.5 % ophthalmic solution, Place 1 drop into both eyes 2 (two) times daily., Disp: , Rfl:         Objective:        BP 116/70 (BP Location: Left Arm, Patient Position: Sitting, Cuff Size: Normal)    Pulse 76   Temp 98.2 F (36.8 C) (Oral)   Ht 5' 4 (1.626 m)   Wt 129 lb 3.2 oz (58.6 kg)   SpO2 99%   BMI 22.18 kg/m   Physical Exam HEENT: Conjunctival injection, nasal erythema, pharyngeal erythema. NECK: Tender cervical lymphadenopathy.  Wt Readings from Last 3 Encounters:  08/15/24 129 lb 3.2 oz (58.6 kg)  07/03/24 131 lb (59.4 kg)  07/02/24 128 lb 3.2 oz (58.2 kg)    Physical Exam Constitutional:      General: She is not in acute distress.    Appearance: Normal appearance. She is normal weight. She is not ill-appearing, toxic-appearing or diaphoretic.  HENT:     Head: Normocephalic.     Right Ear: Tympanic membrane normal.     Left Ear: Tympanic membrane normal.     Nose:     Right Sinus: Maxillary sinus tenderness present.     Left Sinus: Maxillary sinus tenderness present.     Mouth/Throat:     Mouth: Mucous membranes are dry.     Pharynx: Posterior oropharyngeal erythema and postnasal drip present. No oropharyngeal exudate.     Tonsils: No tonsillar exudate.  Eyes:     Extraocular Movements: Extraocular movements intact.     Pupils: Pupils are equal, round, and reactive to light.  Cardiovascular:     Rate and Rhythm: Normal rate and regular rhythm.     Pulses: Normal pulses.     Heart sounds: Normal heart sounds.  Pulmonary:     Effort: Pulmonary effort is normal.     Breath sounds: Normal breath sounds.  Musculoskeletal:     Cervical back: Normal range of motion.  Lymphadenopathy:     Cervical: Cervical adenopathy present.     Right cervical: Superficial cervical adenopathy present.     Left cervical: Superficial cervical adenopathy present.  Neurological:     General: No focal deficit present.     Mental Status: She is alert and oriented to person, place, and time. Mental status is at baseline.  Psychiatric:        Mood and Affect: Mood normal.        Behavior: Behavior normal.        Thought Content: Thought content normal.        Judgment:  Judgment normal.          Results LABS COVID-19 test: negative Strep test: negative  Assessment & Plan:   Assessment and Plan Assessment & Plan Acute pharyngitis Severe sore throat, redness, and enlarged lymph nodes. Negative COVID and strep tests, but clinical presentation suggests possible bacterial etiology. Symptoms include sinus pressure, cough, and facial pain. Differential includes viral and bacterial causes, with consideration for strep despite negative test due to clinical presentation. - Prescribed Augmentin  for suspected bacterial pharyngitis. - Advised use of Sudafed and Mucinex for symptom relief, with caution regarding blood pressure monitoring. - Advised to  avoid antihistamines and antimuscarinics due to glaucoma.  Insomnia Likely exacerbated by acute pharyngitis and associated discomfort. Reports significant sleep disturbance with only two hours of sleep in the past week.  Primary open-angle glaucoma, bilateral, moderate stage Glaucoma management requires avoidance of certain medications that may exacerbate intraocular pressure. - Advised to avoid antihistamines and antimuscarinics.        Return if symptoms worsen or fail to improve.     Ginger Patrick, MSN, APRN, FNP-C [facility]     "

## 2024-09-01 ENCOUNTER — Other Ambulatory Visit: Payer: Self-pay | Admitting: Cardiovascular Disease

## 2024-09-01 ENCOUNTER — Other Ambulatory Visit: Payer: Self-pay | Admitting: Family

## 2024-09-01 DIAGNOSIS — R809 Proteinuria, unspecified: Secondary | ICD-10-CM

## 2024-09-03 ENCOUNTER — Other Ambulatory Visit (HOSPITAL_COMMUNITY): Payer: Self-pay

## 2024-09-03 ENCOUNTER — Telehealth: Payer: Self-pay | Admitting: Cardiovascular Disease

## 2024-09-03 MED ORDER — ATORVASTATIN CALCIUM 40 MG PO TABS
40.0000 mg | ORAL_TABLET | Freq: Every day | ORAL | 3 refills | Status: DC
  Filled 2024-09-03: qty 30, 30d supply, fill #0

## 2024-09-03 NOTE — Telephone Encounter (Signed)
 Refills has been sent to the pharmacy.

## 2024-09-03 NOTE — Telephone Encounter (Signed)
°*  STAT* If patient is at the pharmacy, call can be transferred to refill team.   1. Which medications need to be refilled? (please list name of each medication and dose if known) atorvastatin  (LIPITOR ) 40 MG tablet    2. Would you like to learn more about the convenience, safety, & potential cost savings by using the [facility]?    3. Are you open to using the [facility] (Type [facility].  ).   4. Which pharmacy/location (including street and city if local pharmacy) is medication to be sent to? [facility] - [place], [place] - [address]    5. Do they need a 30 day or 90 day supply? 90 day

## 2024-09-04 ENCOUNTER — Other Ambulatory Visit (HOSPITAL_COMMUNITY): Payer: Self-pay

## 2024-09-04 MED ORDER — ATORVASTATIN CALCIUM 40 MG PO TABS: 40.0000 mg | ORAL_TABLET | Freq: Every day | ORAL | 3 refills | Status: AC

## 2024-09-04 NOTE — Addendum Note (Signed)
 Addended by: DARIO IZETTA CROME on: 09/04/2024 11:34 AM   Modules accepted: Orders

## 2024-10-10 ENCOUNTER — Encounter: Payer: Self-pay | Admitting: Pediatrics

## 2024-10-29 ENCOUNTER — Ambulatory Visit: Payer: Self-pay

## 2024-10-29 ENCOUNTER — Other Ambulatory Visit: Payer: Self-pay

## 2024-10-29 VITALS — Ht 64.0 in | Wt 129.0 lb

## 2024-10-29 DIAGNOSIS — Z1211 Encounter for screening for malignant neoplasm of colon: Secondary | ICD-10-CM

## 2024-10-29 MED ORDER — NA SULFATE-K SULFATE-MG SULF 17.5-3.13-1.6 GM/177ML PO SOLN: 1.0000 | Freq: Once | ORAL | 0 refills | Status: AC

## 2024-10-29 NOTE — Progress Notes (Signed)
 Denies allergies to eggs or soy products. Denies complication of anesthesia or sedation. Denies use of weight loss medication. Denies use of O2.   Emmi instructions given for colonoscopy.

## 2024-10-30 ENCOUNTER — Encounter: Payer: Self-pay | Admitting: Pediatrics

## 2024-11-12 ENCOUNTER — Encounter: Admitting: Pediatrics

## 2025-07-03 ENCOUNTER — Encounter: Admitting: Family
